# Patient Record
Sex: Female | Born: 1950 | Race: White | Hispanic: No | Marital: Married | Smoking: Current every day smoker
Health system: Southern US, Community
[De-identification: ages and names within clinical notes are randomized; demographics above are authoritative.]

## PROBLEM LIST (undated history)

## (undated) DIAGNOSIS — E079 Disorder of thyroid, unspecified: Secondary | ICD-10-CM

## (undated) DIAGNOSIS — E785 Hyperlipidemia, unspecified: Secondary | ICD-10-CM

## (undated) DIAGNOSIS — J449 Chronic obstructive pulmonary disease, unspecified: Secondary | ICD-10-CM

## (undated) DIAGNOSIS — J302 Other seasonal allergic rhinitis: Secondary | ICD-10-CM

## (undated) DIAGNOSIS — K5790 Diverticulosis of intestine, part unspecified, without perforation or abscess without bleeding: Secondary | ICD-10-CM

## (undated) DIAGNOSIS — H269 Unspecified cataract: Secondary | ICD-10-CM

## (undated) DIAGNOSIS — K2970 Gastritis, unspecified, without bleeding: Secondary | ICD-10-CM

## (undated) DIAGNOSIS — I10 Essential (primary) hypertension: Secondary | ICD-10-CM

## (undated) DIAGNOSIS — K219 Gastro-esophageal reflux disease without esophagitis: Secondary | ICD-10-CM

## (undated) DIAGNOSIS — R197 Diarrhea, unspecified: Secondary | ICD-10-CM

## (undated) DIAGNOSIS — F329 Major depressive disorder, single episode, unspecified: Secondary | ICD-10-CM

## (undated) DIAGNOSIS — F32A Depression, unspecified: Secondary | ICD-10-CM

## (undated) DIAGNOSIS — M1990 Unspecified osteoarthritis, unspecified site: Secondary | ICD-10-CM

## (undated) DIAGNOSIS — F419 Anxiety disorder, unspecified: Secondary | ICD-10-CM

## (undated) HISTORY — DX: Disorder of thyroid, unspecified: E07.9

## (undated) HISTORY — DX: Diarrhea, unspecified: R19.7

## (undated) HISTORY — DX: Gastro-esophageal reflux disease without esophagitis: K21.9

## (undated) HISTORY — DX: Depression, unspecified: F32.A

## (undated) HISTORY — DX: Other seasonal allergic rhinitis: J30.2

## (undated) HISTORY — PX: TONSILLECTOMY: SUR1361

## (undated) HISTORY — DX: Unspecified osteoarthritis, unspecified site: M19.90

## (undated) HISTORY — DX: Anxiety disorder, unspecified: F41.9

## (undated) HISTORY — DX: Hyperlipidemia, unspecified: E78.5

## (undated) HISTORY — PX: CARPAL TUNNEL RELEASE: SHX101

## (undated) HISTORY — DX: Major depressive disorder, single episode, unspecified: F32.9

## (undated) HISTORY — DX: Unspecified cataract: H26.9

## (undated) HISTORY — DX: Essential (primary) hypertension: I10

## (undated) HISTORY — DX: Gastritis, unspecified, without bleeding: K29.70

## (undated) HISTORY — DX: Chronic obstructive pulmonary disease, unspecified: J44.9

---

## 1981-04-11 HISTORY — PX: TUBAL LIGATION: SHX77

## 1997-12-08 ENCOUNTER — Inpatient Hospital Stay (HOSPITAL_COMMUNITY): Admission: RE | Admit: 1997-12-08 | Discharge: 1997-12-13 | Payer: Self-pay | Admitting: *Deleted

## 1997-12-16 ENCOUNTER — Encounter (HOSPITAL_COMMUNITY): Admission: RE | Admit: 1997-12-16 | Discharge: 1998-03-16 | Payer: Self-pay

## 1999-02-11 ENCOUNTER — Encounter: Payer: Self-pay | Admitting: Obstetrics and Gynecology

## 1999-02-11 ENCOUNTER — Encounter: Admission: RE | Admit: 1999-02-11 | Discharge: 1999-02-11 | Payer: Self-pay | Admitting: Obstetrics and Gynecology

## 2000-01-14 ENCOUNTER — Other Ambulatory Visit: Admission: RE | Admit: 2000-01-14 | Discharge: 2000-01-14 | Payer: Self-pay | Admitting: Obstetrics and Gynecology

## 2000-02-22 ENCOUNTER — Encounter: Payer: Self-pay | Admitting: Obstetrics and Gynecology

## 2000-02-22 ENCOUNTER — Encounter: Admission: RE | Admit: 2000-02-22 | Discharge: 2000-02-22 | Payer: Self-pay | Admitting: Obstetrics and Gynecology

## 2000-07-05 ENCOUNTER — Encounter: Payer: Self-pay | Admitting: Gastroenterology

## 2000-07-05 ENCOUNTER — Encounter: Admission: RE | Admit: 2000-07-05 | Discharge: 2000-07-05 | Payer: Self-pay | Admitting: Gastroenterology

## 2000-09-06 ENCOUNTER — Ambulatory Visit (HOSPITAL_COMMUNITY): Admission: RE | Admit: 2000-09-06 | Discharge: 2000-09-06 | Payer: Self-pay | Admitting: Gastroenterology

## 2001-01-17 ENCOUNTER — Other Ambulatory Visit: Admission: RE | Admit: 2001-01-17 | Discharge: 2001-01-17 | Payer: Self-pay | Admitting: Obstetrics and Gynecology

## 2001-02-22 ENCOUNTER — Encounter: Admission: RE | Admit: 2001-02-22 | Discharge: 2001-02-22 | Payer: Self-pay | Admitting: Obstetrics and Gynecology

## 2001-02-22 ENCOUNTER — Encounter: Payer: Self-pay | Admitting: Obstetrics and Gynecology

## 2002-01-21 ENCOUNTER — Other Ambulatory Visit: Admission: RE | Admit: 2002-01-21 | Discharge: 2002-01-21 | Payer: Self-pay | Admitting: Obstetrics and Gynecology

## 2002-03-06 ENCOUNTER — Encounter: Payer: Self-pay | Admitting: Obstetrics and Gynecology

## 2002-03-06 ENCOUNTER — Encounter: Admission: RE | Admit: 2002-03-06 | Discharge: 2002-03-06 | Payer: Self-pay | Admitting: Obstetrics and Gynecology

## 2003-02-19 ENCOUNTER — Other Ambulatory Visit: Admission: RE | Admit: 2003-02-19 | Discharge: 2003-02-19 | Payer: Self-pay | Admitting: Obstetrics and Gynecology

## 2003-03-11 ENCOUNTER — Encounter: Admission: RE | Admit: 2003-03-11 | Discharge: 2003-03-11 | Payer: Self-pay | Admitting: Obstetrics and Gynecology

## 2003-10-21 ENCOUNTER — Encounter: Admission: RE | Admit: 2003-10-21 | Discharge: 2003-10-21 | Payer: Self-pay | Admitting: Obstetrics and Gynecology

## 2004-02-25 ENCOUNTER — Other Ambulatory Visit: Admission: RE | Admit: 2004-02-25 | Discharge: 2004-02-25 | Payer: Self-pay | Admitting: Obstetrics and Gynecology

## 2004-03-15 ENCOUNTER — Encounter: Admission: RE | Admit: 2004-03-15 | Discharge: 2004-03-15 | Payer: Self-pay | Admitting: Obstetrics and Gynecology

## 2005-03-16 ENCOUNTER — Encounter: Admission: RE | Admit: 2005-03-16 | Discharge: 2005-03-16 | Payer: Self-pay | Admitting: Obstetrics and Gynecology

## 2005-05-24 ENCOUNTER — Other Ambulatory Visit: Admission: RE | Admit: 2005-05-24 | Discharge: 2005-05-24 | Payer: Self-pay | Admitting: Obstetrics and Gynecology

## 2005-06-09 ENCOUNTER — Ambulatory Visit: Payer: Self-pay | Admitting: Internal Medicine

## 2006-05-01 ENCOUNTER — Encounter: Admission: RE | Admit: 2006-05-01 | Discharge: 2006-05-01 | Payer: Self-pay | Admitting: Obstetrics and Gynecology

## 2006-05-30 ENCOUNTER — Ambulatory Visit: Payer: Self-pay | Admitting: Internal Medicine

## 2006-06-05 ENCOUNTER — Encounter (INDEPENDENT_AMBULATORY_CARE_PROVIDER_SITE_OTHER): Payer: Self-pay | Admitting: Specialist

## 2006-06-05 ENCOUNTER — Ambulatory Visit (HOSPITAL_COMMUNITY): Admission: RE | Admit: 2006-06-05 | Discharge: 2006-06-05 | Payer: Self-pay | Admitting: Internal Medicine

## 2006-06-09 ENCOUNTER — Ambulatory Visit: Payer: Self-pay | Admitting: Internal Medicine

## 2007-05-08 ENCOUNTER — Encounter: Admission: RE | Admit: 2007-05-08 | Discharge: 2007-05-08 | Payer: Self-pay | Admitting: Obstetrics and Gynecology

## 2008-05-20 ENCOUNTER — Encounter: Admission: RE | Admit: 2008-05-20 | Discharge: 2008-05-20 | Payer: Self-pay | Admitting: Obstetrics and Gynecology

## 2008-09-11 ENCOUNTER — Observation Stay (HOSPITAL_COMMUNITY): Admission: EM | Admit: 2008-09-11 | Discharge: 2008-09-12 | Payer: Self-pay | Admitting: Emergency Medicine

## 2009-01-23 ENCOUNTER — Encounter: Admission: RE | Admit: 2009-01-23 | Discharge: 2009-01-23 | Payer: Self-pay | Admitting: Family Medicine

## 2009-06-12 ENCOUNTER — Encounter: Admission: RE | Admit: 2009-06-12 | Discharge: 2009-06-12 | Payer: Self-pay | Admitting: Obstetrics and Gynecology

## 2009-08-05 ENCOUNTER — Ambulatory Visit (HOSPITAL_BASED_OUTPATIENT_CLINIC_OR_DEPARTMENT_OTHER): Admission: RE | Admit: 2009-08-05 | Discharge: 2009-08-05 | Payer: Self-pay | Admitting: Orthopedic Surgery

## 2010-04-07 ENCOUNTER — Ambulatory Visit
Admission: RE | Admit: 2010-04-07 | Discharge: 2010-04-07 | Payer: Self-pay | Source: Home / Self Care | Attending: Orthopedic Surgery | Admitting: Orthopedic Surgery

## 2010-05-02 ENCOUNTER — Encounter: Payer: Self-pay | Admitting: Obstetrics and Gynecology

## 2010-05-31 NOTE — Op Note (Signed)
  Breanna Wolfe, Breanna Wolfe               ACCOUNT NO.:  [id number]  MEDICAL RECORD NO.:  [id number]          PATIENT TYPE:  AMB  LOCATION:  DSC                          FACILITY:  [facility]  PHYSICIAN:  Cindee Salt, M.D.       DATE OF BIRTH:  Jan 15, 1951  DATE OF PROCEDURE: DATE OF DISCHARGE:                              OPERATIVE REPORT   PREOPERATIVE DIAGNOSIS:  Carpal tunnel syndrome, right hand.  POSTOPERATIVE DIAGNOSIS:  Carpal tunnel syndrome, right hand.  OPERATION:  Decompression, right median nerve.  SURGEON:  Cindee Salt, MD  ASSISTANT:  Carolyne Fiscal, RN  ANESTHESIA:  Forearm based IV regional with local infiltration.  DATE OF OPERATION:  April 07, 2010.  ANESTHESIOLOGISTJean Rosenthal.  HISTORY:  The patient is a 60 year old female with a history of carpal tunnel syndrome.  EMG nerve conduction is positive.  This has not responded to conservative treatment.  She has elected to undergo surgical decompression.  Pre, peri, and postoperative course had been discussed along with risks and complications.  She is aware that there is no guarantee with surgery, possibility of infection, recurrence injury to arteries, nerves, tendons, incomplete relief of symptoms, dystrophy.  In the preoperative area, the patient is seen.  The extremity was marked by both the patient and surgeon.  Antibiotic given.  PROCEDURE:  The patient was brought to the operating room where a forearm based IV regional anesthetic was carried out without difficulty. She was prepped using ChloraPrep, supine position, right arm free.  A 3- minute dry time was allowed.  Time-out was taken confirming the patient procedure.  A longitudinal incision was made in the palm, carried down through subcutaneous tissue.  Bleeders were electrocauterized.  Palmar fascia was split, superficial palmar arch was identified.  The flexor tendon to the ring little finger identified. The ulnar side of median nerve at carpal retinaculum was  incised with sharp dissection.  A right- angle and Sewell retractor were placed between skin and forearm fascia. The fascia was released for approximately a centimeter and half proximal to the wrist crease under direct vision.  Canal was explored.  Air compression to the nerve was apparent.  No further lesions were identified.  The wound was irrigated.  Skin closed with interrupted 5-0 Vicryl Rapide sutures.  A sterile compressive dressing and splint was applied after infiltration of the area of the incision with 0.25% Marcaine without epinephrine, 5 mL was used.  The patient tolerated the procedure well and was taken to the recovery room for observation in satisfactory condition.  She will be discharged home to return to the [facility] in 1 week on Vicodin.          ______________________________ Cindee Salt, M.D.     GK/MEDQ  D:  04/07/2010  T:  04/07/2010  Job:  [id number]  Electronically Signed by Cindee Salt M.D. on 05/31/2010 02:21:32 PM

## 2010-06-21 LAB — BASIC METABOLIC PANEL
BUN: 21 mg/dL (ref 6–23)
Chloride: 104 mEq/L (ref 96–112)
Creatinine, Ser: 0.84 mg/dL (ref 0.4–1.2)
GFR calc non Af Amer: >60 mL/min (ref >60)
Glucose, Bld: 101 mg/dL — ABNORMAL HIGH (ref 70–99)

## 2010-06-21 LAB — POCT HEMOGLOBIN-HEMACUE: Hemoglobin: 13.6 g/dL (ref 12.0–15.0)

## 2010-06-29 LAB — BASIC METABOLIC PANEL
BUN: 20 mg/dL (ref 6–23)
Chloride: 110 mEq/L (ref 96–112)
Creatinine, Ser: 0.8 mg/dL (ref 0.4–1.2)
Glucose, Bld: 120 mg/dL — ABNORMAL HIGH (ref 70–99)

## 2010-06-29 LAB — POCT HEMOGLOBIN-HEMACUE: Hemoglobin: 12.1 g/dL (ref 12.0–15.0)

## 2010-07-05 ENCOUNTER — Other Ambulatory Visit: Payer: Self-pay | Admitting: Obstetrics and Gynecology

## 2010-07-05 DIAGNOSIS — Z1231 Encounter for screening mammogram for malignant neoplasm of breast: Secondary | ICD-10-CM

## 2010-07-14 ENCOUNTER — Ambulatory Visit
Admission: RE | Admit: 2010-07-14 | Discharge: 2010-07-14 | Disposition: A | Discharge status: Home / Self Care | Payer: Medicare Other | Source: Ambulatory Visit | Attending: Obstetrics and Gynecology | Admitting: Obstetrics and Gynecology

## 2010-07-14 DIAGNOSIS — Z1231 Encounter for screening mammogram for malignant neoplasm of breast: Secondary | ICD-10-CM

## 2010-07-19 LAB — APTT: aPTT: 25 seconds (ref 24–37)

## 2010-07-19 LAB — POCT CARDIAC MARKERS
CKMB, poc: 1.6 ng/mL (ref 1.0–8.0)
Myoglobin, poc: 88.5 ng/mL (ref 12–200)
Troponin i, poc: <0.05 ng/mL (ref 0.00–0.09)

## 2010-07-19 LAB — CARDIAC PANEL(CRET KIN+CKTOT+MB+TROPI)
CK, MB: 2.3 ng/mL (ref 0.3–4.0)
CK, MB: 2.5 ng/mL (ref 0.3–4.0)
Relative Index: 1.8 (ref 0.0–2.5)
Total CK: 119 U/L (ref 7–177)
Total CK: 139 U/L (ref 7–177)
Total CK: 140 U/L (ref 7–177)
Troponin I: 0.01 ng/mL (ref 0.00–0.06)

## 2010-07-19 LAB — POCT I-STAT, CHEM 8
BUN: 21 mg/dL (ref 6–23)
Calcium, Ion: 1.09 mmol/L — ABNORMAL LOW (ref 1.12–1.32)
Chloride: 104 mEq/L (ref 96–112)
HCT: 42 % (ref 36.0–46.0)
Potassium: 4.3 mEq/L (ref 3.5–5.1)

## 2010-07-19 LAB — LIPID PANEL
Cholesterol: 315 mg/dL — ABNORMAL HIGH (ref 0–200)
LDL Cholesterol: 194 mg/dL — ABNORMAL HIGH (ref 0–99)
Total CHOL/HDL Ratio: 3.5 RATIO
Triglycerides: 157 mg/dL — ABNORMAL HIGH (ref <150)

## 2010-07-19 LAB — PROTIME-INR: INR: 0.9 (ref 0.00–1.49)

## 2010-08-24 NOTE — H&P (Signed)
NAMEGLENDENE, Breanna Wolfe               ACCOUNT NO.:  [id number]   MEDICAL RECORD NO.:  [id number]          PATIENT TYPE:  OBV   LOCATION:  3732                         FACILITY:  [facility]   PHYSICIAN:  Lonia Blood, M.D.       DATE OF BIRTH:  [birth year]   DATE OF ADMISSION:  09/11/2008  DATE OF DISCHARGE:                              HISTORY & PHYSICAL   PRIMARY CARE PHYSICIAN:  Emeterio Reeve, MD   CHIEF COMPLAINT:  Chest pain.   HISTORY OF PRESENT ILLNESS:  Breanna Wolfe is a 60 year old woman with  history of tobacco abuse, woke up at 5:30 a.m. with some severe  substernal chest pain.  This was followed by some nausea, weakness, and  radiation to the jaw.  At the time of the event, the blood pressure was  extremely elevated.  The patient took an aspirin, called 911, and came  to the emergency room.  She does not have any symptoms currently.  She  does not have any personal cardiac history or strong family history of  coronary artery disease.   PAST MEDICAL HISTORY:  1. Hypothyroidism.  2. Gastroesophageal reflux disease.  3. Depression.  4. Anxiety.  5. Irritable bowel syndrome.  6. Hypertension.  7. Osteoarthritis.   PAST SURGICAL HISTORY:  Tonsillectomy and bilateral tubal ligation.   SOCIAL HISTORY:  The patient quit smoking 6 weeks ago.  She is retired.  Does not drink alcohol.  Does not use any illicit drugs.   FAMILY HISTORY:  The patient's mother is alive and has hypothyroidism  and hypertension as well as carotid endarterectomy.  The patient's  father passed away of unknown causes.  The patient has 3 sisters and 1  brother, they do not have coronary artery disease.   HOME MEDICATIONS:  1. Atenolol 50 mg twice a day.  2. Levothyroxine 150 mcg daily.  3. Mobic 15 mg daily as needed.  4. Omeprazole 40 mg daily.  5. Wellbutrin 150 mg twice a day.  6. Zoloft 100 mg daily.   REVIEW OF SYSTEMS:  As per HPI.  Also positive for some joint achiness  and pain.  All other  systems reviewed are negative.   PHYSICAL EXAMINATION:  VITAL SIGNS:  Upon admission, temperature is  97.6, heart rate 71, respiratory rate 20, blood pressure 135/89,  saturation 96% on room air.  GENERAL:  The patient is alert, oriented, in no acute distress.  HEENT:  Head normocephalic, atraumatic.  Eyes pupils equal, round,  reactive to light and accommodation.  Extraocular movements intact.  Throat clear.  NECK:  Supple.  No JVD.  CHEST:  Clear to auscultation without wheezes, rhonchi, or crackles.  HEART:  Regular rate and rhythm without murmurs, rubs, or gallops.  ABDOMEN:  Soft, nontender, nondistended.  Bowels sounds are present.  SKIN:  Warm and dry without any suspicious-looking rashes.  NEUROLOGICAL:  Cranial nerves II-XII intact.  Strength 5/5 in all 4  extremities.  Sensation intact.   LABORATORY VALUES:  On admission, myoglobin 18.5, platelet count 122.  Sodium 139, potassium 4.3, chloride 104, bicarbonate 25, BUN 21,  creatinine 1.  EKG shows normal sinus rhythm without any significant ST-  T changes.   ASSESSMENT AND PLAN:  1. Chest pain.  The patient's story is compatible with typical angina      due to location, short duration, and no clear explanation to      suggest other pathology.  She has a history of tobacco abuse, and      based on her age, she is an intermediate risk for significant CAD,      according to [facility] clinical criteria.  Our plan is to place the      patient on observation, rule her out for myocardial infarction by      cycling cardiac enzymes, and if she does well overnight, arrange      for an outpatient stress testing. We will send also a fasting lipid      panel.  2. Gastroesophageal reflux disease.  Continue proton pump inhibitor      without changes and assess compliance  3. Depression and anxiety.  Continue Wellbutrin and Zoloft without      changes.  4. Hypothyroidism - we will check a TSH and continue levothyroxin.      Lonia Blood,  M.D.  Electronically Signed     SL/MEDQ  D:  09/11/2008  T:  09/12/2008  Job:  [id number]   cc:   Emeterio Reeve, MD

## 2010-08-27 NOTE — Assessment & Plan Note (Signed)
[facility]                         GASTROENTEROLOGY OFFICE NOTE   NAME:Dirks, YOSHIKO KELEHER                      MRN:          [id number]  DATE:05/30/2006                            DOB:          1950-10-01    Ms. Piazza is a very nice 60 year old white female who is here today  with the same complaints as a year ago of gastroesophageal reflux and  left lower quadrant abdominal pain.  We saw her on June 09, 2005 because  of gastroesophageal reflux and abdominal pain, and it was my impression  that she had gastroesophageal reflux aggravated by smoking and stress.  She already had a previous colonoscopy in 2002, but because of family  history of rectal cancer in her mother, she is due for another  colonoscopy.  She has persistent left lower quadrant anterior abdominal  pain, which is worse when she is under stress.  There was no definite  diverticulosis on colonoscopy 6 years ago.  She denies any rectal  bleeding.  She continues to smoke and drinks excessive amount of  caffeine through the day.   MEDICATIONS:  1. Nexium 40 mg p.o. daily.  2. Atenolol 50 mg p.o. daily.  3. Zoloft 100 mg p.o. daily.  4. Synthroid 150 mcg p.o. daily.  5. Mobic has been on hold.   PHYSICAL EXAMINATION:  Blood pressure 130/72.  Pulse 68.  Weight 148  pounds.  She was alert, oriented, and in no distress.  LUNGS:  Clear auscultation.  COR:  Normal S1 and normal S2.  There was minimal discomfort in the subxyphoid area.  No discomfort at  the sternal area.  Abdomen was otherwise soft, but there was definite  tenderness throughout the sigmoid colon and left middle quadrant of the  abdomen.  No fullness or palpable mass.  EXTREMITIES:  No edema.   IMPRESSION:  62. A 60 year old white female with gastroesophageal reflux aggravated      by stress and smoking.  2. Suspect irritable bowel syndrome with predominant left lower      quadrant abdominal pain.  Rule out  diverticulosis.   PLAN:  1. As per patient's request, we will switch from Nexium to Protonix 40      mg p.o. b.i.d. x1 week, then daily.  2. Schedule upper endoscopy.  3. Levsin sublingually 0.125 mg b.i.d.  4. Colonoscopy scheduled.  5. Quit smoking.     Hedwig Morton. Juanda Chance, MD  Electronically Signed    DMB/MedQ  DD: 05/30/2006  DT: 05/30/2006  Job #: [id number]   cc:   Oley Balm. Georgina Pillion, M.D.

## 2010-08-27 NOTE — Discharge Summary (Signed)
NAMEMAIGEN, MOZINGO               ACCOUNT NO.:  [id number]   MEDICAL RECORD NO.:  [id number]          PATIENT TYPE:  OBV   LOCATION:  3732                         FACILITY:  [facility]   PHYSICIAN:  Lonia Blood, M.D.       DATE OF BIRTH:  1950/12/03   DATE OF ADMISSION:  09/11/2008  DATE OF DISCHARGE:  09/12/2008                               DISCHARGE SUMMARY   PRIMARY CARE PHYSICIAN:  Emeterio Reeve, MD   DISCHARGE DIAGNOSES:  1. Chest pain - ruled out for myocardial infarction.  2. Hypothyroidism - with persistent elevated TSH.  3. Gastroesophageal reflux disease.  4. Depression.  5. History of tobacco abuse, recently quit smoking.  6. Hypertension.  7. Hyperlipidemia.   DISCHARGE MEDICATIONS:  1. Atenolol 50 mg twice a day.  2. Levothyroxine 175 mcg daily.  3. Omeprazole 40 mg daily.  4. Zoloft 100 mg daily.  5. Aspirin 81 mg daily.  6. Ranitidine 75 mg by mouth as needed for breakthrough acid reflux      symptoms.  7. Zocor 40 mg daily.   CONDITION ON DISCHARGE:  Mrs. Calais discharged in good condition.  She  is chest painfree and she will follow up with Dr. Eldridge Dace from [facility] as well as with her primary care physician.   PROCEDURES THIS ADMISSION:  No procedures done.   CONSULTATIONS THIS ADMISSION:  No consultations obtained.   HISTORY AND PHYSICAL:  Refer to dictated H and P done by Dr. Lavera Guise on  September 11, 2008.   HOSPITAL COURSE:  Chest pain.  Mrs. Bialas was admitted with a history  of chest discomfort from the emergency room.  She did not have any EKG  changes, and her troponins remained negative.  While her symptoms could  have been due to coronary artery disease, there is a good likelihood  that her symptoms were actually due to severe gastroesophageal reflux  disease.  Ms. Warburton has intermediate risk for coronary artery disease  based on [facility] clinical criteria and she will follow up with Dr. Eldridge Dace  for consultation and consideration of a  stress test.  The patient was  encouraged to continue with her smoking cessation. She was started on  aspirin and the other risk factors were addressed as well.  We performed  a fasting lipid panel and the patient's total cholesterol was 315 and  LDL was  194.  She was initiated on a statin and she will follow up with her  primary care physician.  Also Ms. Duval's TSH was found to be elevated  at 16.7.  Her Synthroid was increased and she will follow up with her  primary care physician for recheck of TSH in 1 month.      Lonia Blood, M.D.  Electronically Signed     SL/MEDQ  D:  09/15/2008  T:  09/16/2008  Job:  [id number]   cc:   Emeterio Reeve, MD  Corky Crafts, MD

## 2010-08-27 NOTE — Procedures (Signed)
[facility]  Patient:    Breanna Wolfe, Breanna Wolfe                      MRN: [id number] Proc. Date: 09/06/00 Adm. Date:  [id number] Attending:  Orland Mustard CC:         Oley Balm. Georgina Pillion, M.D.                           Procedure Report  PROCEDURE:  Colonoscopy.  ENDOSCOPIST:  Llana Aliment. Edwards, M.D.  MEDICATION:  Fentanyl 75 mcg and Versed 9 mg IV.  INDICATION:  Left lower quadrant pain, treated empirically for diverticulitis, CT scan showing mild wall thickening in the sigmoid colon with occasional tics, possible diverticulitis versus other lesion such as tumor.  This procedure is done to evaluate this.  SCOPE:  Pediatric video colonoscope.  DESCRIPTION OF PROCEDURE:  Procedure had been explained to patient and consent obtained.  With the patient in the left lateral decubitus position, the pediatric video colonoscope was inserted and advanced under direct visualization.  Prep was excellent and easily advanced to the cecum.  Right lower quadrant was transilluminated and ileocecal valve seen.  Scope withdrawn; the cecum, ascending colon, hepatic flexure, transverse colon, splenic flexure, descending and sigmoid colon were seen well.  Multiple passes were made through the sigmoid colon.  There were only a few scattered diverticula.  No polyps, tumors or other lesions were seen.  Scope was withdrawn into the rectum and the rectum was free of polyps.  There were internal hemorrhoids in the anal canal.  The scope was withdrawn.  Patient tolerated procedure well.  ASSESSMENT:  Abnormal CT with no evidence of diverticular disease of any significance, no tumor, etc.  At this point, the patient does have occasional scattered diverticula and could have had mild diverticulitis but currently has nothing to suggest that.  PLAN:  We will treat as if this had been diverticulitis with chronic Metamucil or Citrucel.  We will see back in the office in six months or  sooner as needed.DD:  09/06/00 TD:  09/06/00 Job: [id number]

## 2010-10-06 ENCOUNTER — Telehealth: Payer: Self-pay | Admitting: Internal Medicine

## 2010-10-07 NOTE — Telephone Encounter (Signed)
Pt states that she is having problems with constipation. She has noticed a change in her bowel habits and would like to be seen. Appt made for pt to see Dr. Juanda Chance 10/21/10@2pm . Pt aware of appt date and time.

## 2010-10-20 ENCOUNTER — Encounter: Payer: Self-pay | Admitting: Internal Medicine

## 2010-10-21 ENCOUNTER — Encounter: Payer: Self-pay | Admitting: Internal Medicine

## 2010-10-21 ENCOUNTER — Ambulatory Visit (INDEPENDENT_AMBULATORY_CARE_PROVIDER_SITE_OTHER): Payer: Medicare Other | Admitting: Internal Medicine

## 2010-10-21 VITALS — BP 128/62 | HR 74 | Ht 63.0 in | Wt 162.0 lb

## 2010-10-21 DIAGNOSIS — K219 Gastro-esophageal reflux disease without esophagitis: Secondary | ICD-10-CM

## 2010-10-21 DIAGNOSIS — K589 Irritable bowel syndrome without diarrhea: Secondary | ICD-10-CM

## 2010-10-21 MED ORDER — HYDROCORTISONE ACE-PRAMOXINE 2.5-1 % RE CREA: TOPICAL_CREAM | Freq: Two times a day (BID) | RECTAL | Status: AC

## 2010-10-21 MED ORDER — DICYCLOMINE HCL 10 MG PO CAPS: 10.0000 mg | ORAL_CAPSULE | ORAL | Status: DC

## 2010-10-21 NOTE — Patient Instructions (Addendum)
We have sent the following medications to your pharmacy for you to pick up at your convenience: Bentyl 10 mg. Take 1 tablet by mouth every morning. Analpram 2.5% cream. Apply twice daily to rectum You will be due for a recall colonoscopy in February 2013.Dr Mila Palmer

## 2010-10-21 NOTE — Progress Notes (Signed)
Breanna Wolfe 10/07/1950 MRN [id number]    History of Present Illness:  This is a 60 year old white female with irritable bowel syndrome and periodic urgent diarrhea. She also has gastroesophageal reflux which is controlled with Prilosec 20 mg a day. She has been under a lot of stress. She takes care of her grandchildren 2 days a week. Diarrhea causes her rectum to be inflamed and irritated for several days. She denies any rectal bleeding. Her mother was diagnosed with rectal cancer. Patient's last colonoscopy in 2008 was normal. An upper endoscopy with biopsies in February 2008 showed gastritis related to anti-inflammatory agents. She stopped smoking 2 years ago. She still drinks several caffeinated beverages per day.    Past Medical History  Diagnosis Date  . GERD (gastroesophageal reflux disease)   . Hypertension   . DJD (degenerative joint disease)   . Anxiety   . Depression   . Gastritis   . Hyperlipemia   . Constipation   . Diarrhea    Past Surgical History  Procedure Date  . Tubal ligation 1983  . Tonsillectomy   . Carpal tunnel release     Bilateral     reports that she has quit smoking. She has never used smokeless tobacco. She reports that she drinks alcohol. She reports that she does not use illicit drugs. family history includes Breast cancer in her maternal aunt and Rectal cancer in her mother.  There is no history of Colon cancer. Allergies  Allergen Reactions  . Iohexol   . Sulfa Drugs Cross Reactors         Review of Systems: Denies any upper GI symptoms. Positive for crampy abdominal pain urgent diarrhea  The remainder of the 10  point ROS is negative except as outlined in H&P   Physical Exam: General appearance  Well developed, in no distress. Eyes- non icteric. HEENT nontraumatic, normocephalic. Mouth no lesions, tongue papillated, no cheilosis. Neck supple without adenopathy, thyroid not enlarged, no carotid bruits, no JVD. Lungs Clear to  auscultation bilaterally. Cor normal S1 normal S2, regular rhythm , no murmur,  quiet precordium. Abdomen soft with mild tenderness in left lower quadrant. No palpable mass. Upper abdomen unremarkable. Rectal: Small external hemorrhoids. Normal rectal sphincter tone. Soft Hemoccult-negative stool. Extremities no pedal edema. Skin no lesions. Neurological alert and oriented x 3. Psychological normal mood and affect.  Assessment and Plan:  Problem #1 irritable bowel syndrome. She is up-to-date on her colonoscopy. There is no occult GI blood loss. Symptoms are stress related. We will start her on Bentyl 10 mg every morning. She is to continue probiotics and high fiber diet. A recall colonoscopy will be due in February 2013.  Problem #2 rectal irritation. We will start Analpram cream 2.5% when necessary.  Problem #3 gastroesophageal reflux controlled with omeprazole 20 mg daily.      10/21/2010 Lina Sar

## 2011-04-12 HISTORY — PX: CARPAL TUNNEL RELEASE: SHX101

## 2011-05-25 ENCOUNTER — Ambulatory Visit (INDEPENDENT_AMBULATORY_CARE_PROVIDER_SITE_OTHER): Payer: Medicare Other | Admitting: Internal Medicine

## 2011-05-25 ENCOUNTER — Telehealth: Payer: Self-pay | Admitting: Internal Medicine

## 2011-05-25 DIAGNOSIS — R0602 Shortness of breath: Secondary | ICD-10-CM

## 2011-05-25 LAB — PULMONARY FUNCTION TEST

## 2011-05-25 NOTE — Telephone Encounter (Signed)
I spoke with pt and advised her she wanted to know when she will find out her pft results. I advised her that she will have to call her doctor's office that ordered this for those results. Nothing further was needed

## 2011-05-25 NOTE — Progress Notes (Signed)
PFT done today. 

## 2011-06-06 ENCOUNTER — Encounter: Payer: Self-pay | Admitting: Internal Medicine

## 2011-07-01 ENCOUNTER — Ambulatory Visit (AMBULATORY_SURGERY_CENTER): Payer: Medicare Other | Admitting: *Deleted

## 2011-07-01 VITALS — Ht 66.0 in | Wt 162.7 lb

## 2011-07-01 DIAGNOSIS — Z1211 Encounter for screening for malignant neoplasm of colon: Secondary | ICD-10-CM

## 2011-07-01 MED ORDER — PEG-KCL-NACL-NASULF-NA ASC-C 100 G PO SOLR: ORAL | Status: DC

## 2011-07-15 ENCOUNTER — Encounter: Payer: Medicare Other | Admitting: Internal Medicine

## 2012-01-31 ENCOUNTER — Encounter: Payer: Self-pay | Admitting: Internal Medicine

## 2012-06-12 ENCOUNTER — Other Ambulatory Visit: Payer: Self-pay

## 2012-06-12 ENCOUNTER — Other Ambulatory Visit: Payer: Self-pay | Admitting: Obstetrics and Gynecology

## 2012-06-12 DIAGNOSIS — M8580 Other specified disorders of bone density and structure, unspecified site: Secondary | ICD-10-CM

## 2012-06-12 DIAGNOSIS — Z1231 Encounter for screening mammogram for malignant neoplasm of breast: Secondary | ICD-10-CM

## 2012-07-19 ENCOUNTER — Ambulatory Visit
Admission: RE | Admit: 2012-07-19 | Discharge: 2012-07-19 | Disposition: A | Discharge status: Home / Self Care | Payer: Medicare PPO | Source: Ambulatory Visit | Attending: Obstetrics and Gynecology | Admitting: Obstetrics and Gynecology

## 2012-07-19 ENCOUNTER — Ambulatory Visit
Admission: RE | Admit: 2012-07-19 | Discharge: 2012-07-19 | Disposition: A | Discharge status: Home / Self Care | Payer: Medicare PPO | Source: Ambulatory Visit

## 2012-07-19 DIAGNOSIS — Z1231 Encounter for screening mammogram for malignant neoplasm of breast: Secondary | ICD-10-CM

## 2012-07-19 DIAGNOSIS — M8580 Other specified disorders of bone density and structure, unspecified site: Secondary | ICD-10-CM

## 2012-11-19 ENCOUNTER — Ambulatory Visit
Admission: RE | Admit: 2012-11-19 | Discharge: 2012-11-19 | Disposition: A | Discharge status: Home / Self Care | Payer: Medicare PPO | Source: Ambulatory Visit | Attending: Family Medicine | Admitting: Family Medicine

## 2012-11-19 ENCOUNTER — Other Ambulatory Visit: Payer: Self-pay | Admitting: Family Medicine

## 2012-11-19 DIAGNOSIS — M542 Cervicalgia: Secondary | ICD-10-CM

## 2012-11-19 DIAGNOSIS — M25562 Pain in left knee: Secondary | ICD-10-CM

## 2012-11-19 DIAGNOSIS — M25561 Pain in right knee: Secondary | ICD-10-CM

## 2013-06-03 ENCOUNTER — Encounter: Payer: Self-pay | Admitting: Internal Medicine

## 2013-07-17 ENCOUNTER — Ambulatory Visit (AMBULATORY_SURGERY_CENTER): Payer: Self-pay

## 2013-07-17 VITALS — Ht 66.0 in | Wt 150.0 lb

## 2013-07-17 DIAGNOSIS — Z800 Family history of malignant neoplasm of digestive organs: Secondary | ICD-10-CM

## 2013-07-17 MED ORDER — MOVIPREP 100 G PO SOLR: 1.0000 | Freq: Once | ORAL | Status: DC

## 2013-07-31 ENCOUNTER — Ambulatory Visit (AMBULATORY_SURGERY_CENTER): Payer: Medicare PPO | Admitting: Internal Medicine

## 2013-07-31 ENCOUNTER — Encounter: Payer: Self-pay | Admitting: Internal Medicine

## 2013-07-31 VITALS — BP 151/82 | HR 69 | Temp 98.7°F | Resp 39 | Ht 66.0 in | Wt 150.0 lb

## 2013-07-31 DIAGNOSIS — D126 Benign neoplasm of colon, unspecified: Secondary | ICD-10-CM

## 2013-07-31 DIAGNOSIS — Z1211 Encounter for screening for malignant neoplasm of colon: Secondary | ICD-10-CM

## 2013-07-31 DIAGNOSIS — Z800 Family history of malignant neoplasm of digestive organs: Secondary | ICD-10-CM

## 2013-07-31 MED ORDER — SODIUM CHLORIDE 0.9 % IV SOLN: 500.0000 mL | INTRAVENOUS | Status: DC

## 2013-07-31 NOTE — Patient Instructions (Signed)
No Aspirin or anti inflammatory products for 2 weeks   YOU HAD AN ENDOSCOPIC PROCEDURE TODAY AT [facility]: Refer to the procedure report that was given to you for any specific questions about what was found during the examination.  If the procedure report does not answer your questions, please call your gastroenterologist to clarify.  If you requested that your care partner not be given the details of your procedure findings, then the procedure report has been included in a sealed envelope for you to review at your convenience later.  YOU SHOULD EXPECT: Some feelings of bloating in the abdomen. Passage of more gas than usual.  Walking can help get rid of the air that was put into your GI tract during the procedure and reduce the bloating. If you had a lower endoscopy (such as a colonoscopy or flexible sigmoidoscopy) you may notice spotting of blood in your stool or on the toilet paper. If you underwent a bowel prep for your procedure, then you may not have a normal bowel movement for a few days.  DIET: Your first meal following the procedure should be a light meal and then it is ok to progress to your normal diet.  A half-sandwich or bowl of soup is an example of a good first meal.  Heavy or fried foods are harder to digest and may make you feel nauseous or bloated.  Likewise meals heavy in dairy and vegetables can cause extra gas to form and this can also increase the bloating.  Drink plenty of fluids but you should avoid alcoholic beverages for 24 hours.  ACTIVITY: Your care partner should take you home directly after the procedure.  You should plan to take it easy, moving slowly for the rest of the day.  You can resume normal activity the day after the procedure however you should NOT DRIVE or use heavy machinery for 24 hours (because of the sedation medicines used during the test).    SYMPTOMS TO REPORT IMMEDIATELY: A gastroenterologist can be reached at any hour.  During  normal business hours, 8:30 AM to 5:00 PM Monday through Friday, call [phone].  After hours and on weekends, please call the GI answering service at [phone] who will take a message and have the physician on call contact you.   Following lower endoscopy (colonoscopy or flexible sigmoidoscopy):  Excessive amounts of blood in the stool  Significant tenderness or worsening of abdominal pains  Swelling of the abdomen that is new, acute  Fever of 100F or higher    FOLLOW UP: If any biopsies were taken you will be contacted by phone or by letter within the next 1-3 weeks.  Call your gastroenterologist if you have not heard about the biopsies in 3 weeks.  Our staff will call the home number listed on your records the next business day following your procedure to check on you and address any questions or concerns that you may have at that time regarding the information given to you following your procedure. This is a courtesy call and so if there is no answer at the home number and we have not heard from you through the emergency physician on call, we will assume that you have returned to your regular daily activities without incident.  SIGNATURES/CONFIDENTIALITY: You and/or your care partner have signed paperwork which will be entered into your electronic medical record.  These signatures attest to the fact that that the information above on your After Visit Summary  has been reviewed and is understood.  Full responsibility of the confidentiality of this discharge information lies with you and/or your care-partner.   Information on polyps ,diverticulosis ,and high fiber diet given to you today

## 2013-07-31 NOTE — Progress Notes (Signed)
Called to room to assist during endoscopic procedure.  Patient ID and intended procedure confirmed with present staff. Received instructions for my participation in the procedure from the performing physician.  

## 2013-07-31 NOTE — Op Note (Signed)
[place]  [address]. [place], [id number]   COLONOSCOPY PROCEDURE REPORT  PATIENT: Breanna Wolfe, Breanna Wolfe  MR#: [id number] BIRTHDATE: 24-Jun-1950 , 31  yrs. old GENDER: Female ENDOSCOPIST: Lafayette Dragon, MD REFERRED VH:QIONGE Stephanie Acre, M.D. PROCEDURE DATE:  07/31/2013 PROCEDURE:   Colonoscopy with snare polypectomy First Screening Colonoscopy - Avg.  risk and is 50 yrs.  old or older - No.  Prior Negative Screening - Now for repeat screening. Above average risk  History of Adenoma - Now for follow-up colonoscopy & has been > or = to 3 yrs.  N/A  Polyps Removed Today? Yes. ASA CLASS:   Class II INDICATIONS:Patient's immediate family history of colon cancer and mother with rectal cancer.  Last colonoscopy in February 2008 was normal. MEDICATIONS: MAC sedation, administered by CRNA and Propofol (Diprivan) 250 mg IV  DESCRIPTION OF PROCEDURE:   After the risks benefits and alternatives of the procedure were thoroughly explained, informed consent was obtained.  A digital rectal exam revealed no abnormalities of the rectum.   The LB [id number]  endoscope was introduced through the anus and advanced to the cecum, which was identified by both the appendix and ileocecal valve. No adverse events experienced.   The quality of the prep was excellent, using MoviPrep  The instrument was then slowly withdrawn as the colon was fully examined.      COLON FINDINGS: A sessile polyp measuring 12 mm in size was found at the cecum.  A polypectomy was performed using snare cautery.  The resection was complete and the polyp tissue was completely retrieved.   Mild diverticulosis was noted in the sigmoid colon. Retroflexed views revealed no abnormalities. The time to cecum=4 minutes 09 seconds.  Withdrawal time=8 minutes 41 seconds.  The scope was withdrawn and the procedure completed. COMPLICATIONS: There were no complications.  ENDOSCOPIC IMPRESSION: 1.   Sessile polyp  measuring 12 mm in size was found at the cecum; polypectomy was performed using snare cautery 2.   Mild diverticulosis was noted in the sigmoid colon  RECOMMENDATIONS: 1.  Await pathology results 2.  no aspirin or Advil for 2 weeks Recall colonoscopy pending path report High-fiber diet   eSigned:  Lafayette Dragon, MD 07/31/2013 12:09 PM   cc:   PATIENT NAME:  Breanna Wolfe, Breanna Wolfe MR#: [id number]

## 2013-08-01 ENCOUNTER — Telehealth: Payer: Self-pay | Admitting: *Deleted

## 2013-08-01 NOTE — Telephone Encounter (Signed)
  Follow up Call-  Call back number 07/31/2013  Post procedure Call Back phone  # [phone]  Permission to leave phone message Yes     Patient questions:  Do you have a fever, pain , or abdominal swelling? no Pain Score  0 *  Have you tolerated food without any problems? yes  Have you been able to return to your normal activities? yes  Do you have any questions about your discharge instructions: Diet   no Medications  no Follow up visit  no  Do you have questions or concerns about your Care? no  Actions: * If pain score is 4 or above: No action needed, pain <4.

## 2013-08-05 ENCOUNTER — Encounter: Payer: Self-pay | Admitting: Internal Medicine

## 2013-08-06 ENCOUNTER — Encounter: Payer: Self-pay | Admitting: *Deleted

## 2014-03-12 ENCOUNTER — Ambulatory Visit (INDEPENDENT_AMBULATORY_CARE_PROVIDER_SITE_OTHER): Payer: Medicare PPO | Admitting: Podiatry

## 2014-03-12 ENCOUNTER — Ambulatory Visit (INDEPENDENT_AMBULATORY_CARE_PROVIDER_SITE_OTHER): Payer: Medicare PPO

## 2014-03-12 ENCOUNTER — Encounter: Payer: Self-pay | Admitting: Podiatry

## 2014-03-12 DIAGNOSIS — S93401A Sprain of unspecified ligament of right ankle, initial encounter: Secondary | ICD-10-CM

## 2014-03-12 DIAGNOSIS — R52 Pain, unspecified: Secondary | ICD-10-CM

## 2014-03-12 NOTE — Patient Instructions (Signed)
Wear the ankle stabilizer on the right ankle on a continuous daily basis in athletic style shoe Limit standing walking to tolerance Take existing nabumetone 500 mg twice a day 7 days   Ankle Sprain An ankle sprain is an injury to the strong, fibrous tissues (ligaments) that hold the bones of your ankle joint together.  CAUSES An ankle sprain is usually caused by a fall or by twisting your ankle. Ankle sprains most commonly occur when you step on the outer edge of your foot, and your ankle turns inward. People who participate in sports are more prone to these types of injuries.  SYMPTOMS   Pain in your ankle. The pain may be present at rest or only when you are trying to stand or walk.  Swelling.  Bruising. Bruising may develop immediately or within 1 to 2 days after your injury.  Difficulty standing or walking, particularly when turning corners or changing directions. DIAGNOSIS  Your caregiver will ask you details about your injury and perform a physical exam of your ankle to determine if you have an ankle sprain. During the physical exam, your caregiver will press on and apply pressure to specific areas of your foot and ankle. Your caregiver will try to move your ankle in certain ways. An X-ray exam may be done to be sure a bone was not broken or a ligament did not separate from one of the bones in your ankle (avulsion fracture).  TREATMENT  Certain types of braces can help stabilize your ankle. Your caregiver can make a recommendation for this. Your caregiver may recommend the use of medicine for pain. If your sprain is severe, your caregiver may refer you to a surgeon who helps to restore function to parts of your skeletal system (orthopedist) or a physical therapist. [place] ice to your injury for 1-2 days or as directed by your caregiver. Applying ice helps to reduce inflammation and pain.  Put ice in a plastic bag.  Place a towel between your skin and the  bag.  Leave the ice on for 15-20 minutes at a time, every 2 hours while you are awake.  Only take over-the-counter or prescription medicines for pain, discomfort, or fever as directed by your caregiver.  Elevate your injured ankle above the level of your heart as much as possible for 2-3 days.  If your caregiver recommends crutches, use them as instructed. Gradually put weight on the affected ankle. Continue to use crutches or a cane until you can walk without feeling pain in your ankle.  If you have a plaster splint, wear the splint as directed by your caregiver. Do not rest it on anything harder than a pillow for the first 24 hours. Do not put weight on it. Do not get it wet. You may take it off to take a shower or bath.  You may have been given an elastic bandage to wear around your ankle to provide support. If the elastic bandage is too tight (you have numbness or tingling in your foot or your foot becomes cold and blue), adjust the bandage to make it comfortable.  If you have an air splint, you may blow more air into it or let air out to make it more comfortable. You may take your splint off at night and before taking a shower or bath. Wiggle your toes in the splint several times per day to decrease swelling. SEEK MEDICAL CARE IF:   You have rapidly increasing bruising or  swelling.  Your toes feel extremely cold or you lose feeling in your foot.  Your pain is not relieved with medicine. SEEK IMMEDIATE MEDICAL CARE IF:  Your toes are numb or blue.  You have severe pain that is increasing. MAKE SURE YOU:   Understand these instructions.  Will watch your condition.  Will get help right away if you are not doing well or get worse. Document Released: 03/28/2005 Document Revised: 12/21/2011 Document Reviewed: 04/09/2011 [facility] Patient Information 2015 [place], [place]. This information is not intended to replace advice given to you by your health care provider. Make sure you  discuss any questions you have with your health care provider.

## 2014-03-12 NOTE — Progress Notes (Signed)
   Subjective:    Patient ID: Breanna Wolfe, female    DOB: 10-26-1950, 63 y.o.   MRN: [id number]  HPI  N-SORE L-RT FOOT BACK OF THE HEEL AND OUTSIDE OF THE FOOT. D-6 WEEKS O-SLOWLY C-WORSE A-PRESSURE T-NABUMETONE Patient presents with 2 concerns concerning the right foot. She describes approximately 8 week history of posterior heel pain that has not responded nabumatone 500 mg dispensed #30 with instructions to take twice a day 3 days. Patient was able tolerate this medication without any complaints, however, posterior heel pain persisted  She also describes a twisting injury occurring on 02/26/2014 resulting in a persistent pain on the lateral right ankle this is causes her to limp when she walks, however, patient states she's been too busy to have this evaluated.    Review of Systems  Respiratory: Positive for cough.   Gastrointestinal: Positive for nausea.  Allergic/Immunologic: Positive for environmental allergies.  All other systems reviewed and are negative.      Objective:   Physical Exam  Orientated 3  Vascular: DP pulses 2/4 bilaterally PT pulses 2/4 bilaterally Capillary reflex immediate bilaterally  Neurological: Vibratory sensation intact bilaterally Ankle reflex equal and reactive bilaterally  Dermatological: Residual ecchymosis dorsal lateral right foot Edema right lateral ankle  Musculoskeletal Patient has limping gait favoring right foot Exquisite palpable tenderness over the anterior talofibular and calcaneal fibular ligament right ankle There is no palpable tenderness in the posterior tendo Achilles or posterior heel, right. Range of motion right ankle has no crepitus or restriction Patient is able to heel off bilaterally Patient is able to heel off unilaterally    X-ray examination weightbearing right foot  Intact bony structures without fracture and/or dislocation Posterior and inferior calcaneal spurs Hammertoe deformity  second  Radiographic impression: No acute bony abnormality noted in the right foot      X-ray examination weightbearing right ankle  Intact bony structure without fracture and/or dislocation Ankle mortise unremarkable without deformity  Radiographic impression: No acute bony abnormality noted in the right ankle         Assessment & Plan:   Assessment: Satisfactory neurovascular status Sprained lateral right ankle History of posterior right heel pain, however, upon examination today this area is asymptomatic  Plan: Advised patient that she has significant lateral right ankle sprain which appears to be her dominant problem at this time  Dispensed ankle stabilizer to be worn on a continuous basis on the right ankle 6 weeks Using existing nabumatone 500 mg twice a day 7 days  Reappoint 6 weeks

## 2014-03-13 ENCOUNTER — Encounter: Payer: Self-pay | Admitting: Podiatry

## 2014-03-21 ENCOUNTER — Telehealth: Payer: Self-pay | Admitting: *Deleted

## 2014-03-21 MED ORDER — TRAMADOL HCL 50 MG PO TABS: 50.0000 mg | ORAL_TABLET | Freq: Two times a day (BID) | ORAL | Status: DC

## 2014-03-21 NOTE — Telephone Encounter (Signed)
Looks like she is taking mobic 7.5mg . She can double it and go up to 15mg  daily if needed. Continue with ice/elevation. If she needs pain medication she should come into the office for further evaluation. Thanks.

## 2014-03-21 NOTE — Telephone Encounter (Signed)
Patient was Rx'd Nabumetone by Dr. Amalia Hailey.   Per Dr. Jacqualyn Posey - okay to prescribe Tramadol 50 mg 1 BID 0 refills

## 2014-03-21 NOTE — Telephone Encounter (Signed)
Patient called requesting a prescription for pain meds. She is taking an anti-inflammatory and wearing the brace, but not helping.

## 2014-03-21 NOTE — Telephone Encounter (Signed)
Called patient - LM - left Rx at check in for patient to pick up

## 2014-03-26 ENCOUNTER — Telehealth: Payer: Self-pay | Admitting: *Deleted

## 2014-03-26 NOTE — Telephone Encounter (Signed)
Patient states that she is still in a lot of pain, feels worse. She wants to know what she needs to do. Does she require a referral to an orthopedic?

## 2014-04-16 DIAGNOSIS — S93491D Sprain of other ligament of right ankle, subsequent encounter: Secondary | ICD-10-CM | POA: Diagnosis not present

## 2014-04-23 ENCOUNTER — Ambulatory Visit: Payer: Medicare PPO | Admitting: Podiatry

## 2014-11-07 DIAGNOSIS — J069 Acute upper respiratory infection, unspecified: Secondary | ICD-10-CM | POA: Diagnosis not present

## 2014-12-19 DIAGNOSIS — J309 Allergic rhinitis, unspecified: Secondary | ICD-10-CM | POA: Diagnosis not present

## 2014-12-19 DIAGNOSIS — M25571 Pain in right ankle and joints of right foot: Secondary | ICD-10-CM | POA: Diagnosis not present

## 2015-01-05 DIAGNOSIS — M25571 Pain in right ankle and joints of right foot: Secondary | ICD-10-CM | POA: Diagnosis not present

## 2015-01-05 DIAGNOSIS — S86311D Strain of muscle(s) and tendon(s) of peroneal muscle group at lower leg level, right leg, subsequent encounter: Secondary | ICD-10-CM | POA: Diagnosis not present

## 2015-01-16 DIAGNOSIS — S93491D Sprain of other ligament of right ankle, subsequent encounter: Secondary | ICD-10-CM | POA: Diagnosis not present

## 2015-01-21 DIAGNOSIS — S86311D Strain of muscle(s) and tendon(s) of peroneal muscle group at lower leg level, right leg, subsequent encounter: Secondary | ICD-10-CM | POA: Diagnosis not present

## 2015-01-26 DIAGNOSIS — R05 Cough: Secondary | ICD-10-CM | POA: Diagnosis not present

## 2015-02-16 DIAGNOSIS — E039 Hypothyroidism, unspecified: Secondary | ICD-10-CM | POA: Diagnosis not present

## 2015-03-13 DIAGNOSIS — M542 Cervicalgia: Secondary | ICD-10-CM | POA: Diagnosis not present

## 2015-03-13 DIAGNOSIS — J309 Allergic rhinitis, unspecified: Secondary | ICD-10-CM | POA: Diagnosis not present

## 2015-12-09 ENCOUNTER — Other Ambulatory Visit: Payer: Self-pay | Admitting: Family Medicine

## 2015-12-09 DIAGNOSIS — Z1231 Encounter for screening mammogram for malignant neoplasm of breast: Secondary | ICD-10-CM

## 2015-12-18 ENCOUNTER — Other Ambulatory Visit: Payer: Self-pay | Admitting: Family Medicine

## 2015-12-18 DIAGNOSIS — E2839 Other primary ovarian failure: Secondary | ICD-10-CM

## 2015-12-21 ENCOUNTER — Ambulatory Visit: Payer: Medicare PPO

## 2015-12-21 ENCOUNTER — Ambulatory Visit
Admission: RE | Admit: 2015-12-21 | Discharge: 2015-12-21 | Disposition: A | Discharge status: Home / Self Care | Payer: Medicare Other | Source: Ambulatory Visit | Attending: Family Medicine | Admitting: Family Medicine

## 2015-12-21 DIAGNOSIS — E2839 Other primary ovarian failure: Secondary | ICD-10-CM

## 2015-12-21 DIAGNOSIS — Z1231 Encounter for screening mammogram for malignant neoplasm of breast: Secondary | ICD-10-CM

## 2016-04-11 HISTORY — PX: COLONOSCOPY: SHX174

## 2016-04-13 ENCOUNTER — Ambulatory Visit (INDEPENDENT_AMBULATORY_CARE_PROVIDER_SITE_OTHER): Payer: Medicare Other | Admitting: Podiatry

## 2016-04-13 ENCOUNTER — Encounter: Payer: Self-pay | Admitting: Podiatry

## 2016-04-13 ENCOUNTER — Telehealth: Payer: Self-pay | Admitting: *Deleted

## 2016-04-13 VITALS — BP 128/69 | HR 67

## 2016-04-13 DIAGNOSIS — M79672 Pain in left foot: Secondary | ICD-10-CM

## 2016-04-13 DIAGNOSIS — M79671 Pain in right foot: Secondary | ICD-10-CM | POA: Diagnosis not present

## 2016-04-13 DIAGNOSIS — L84 Corns and callosities: Secondary | ICD-10-CM

## 2016-04-13 MED ORDER — DOXYCYCLINE HYCLATE 100 MG PO TABS: 100.0000 mg | ORAL_TABLET | Freq: Two times a day (BID) | ORAL | 0 refills | Status: DC

## 2016-04-13 NOTE — Telephone Encounter (Signed)
Pt states she has a painful corn and would like to know if she needs an appt. I spoke with pt and told her if she was having pain it would benefit her to be seen. Pt states the soonest the schedulers had was 04/20/2016, and her husband is having surgery then. I told her I would transfer her to the schedulers and to tell them she would see another doctor to get in earlier.

## 2016-04-24 NOTE — Progress Notes (Signed)
Subjective: Patient presents to the office today for chief complaint of painful callus lesions of the feet. Patient states that the pain is ongoing and is affecting their ability to ambulate without pain. Patient presents today for further treatment and evaluation.  Objective:  Physical Exam General: Alert and oriented x3 in no acute distress  Dermatology: Hyperkeratotic lesion present on the interdigital space between digits 4 and 5 left foot consistent with the heloma molle. Pain on palpation with a central nucleated core noted.  Skin is warm, dry and supple bilateral lower extremities. Negative for open lesions or macerations.  Vascular: Palpable pedal pulses bilaterally. No edema or erythema noted. Capillary refill within normal limits.  Neurological: Epicritic and protective threshold grossly intact bilaterally.   Musculoskeletal Exam: Pain on palpation at the keratotic lesion noted. Range of motion within normal limits bilateral. Muscle strength 5/5 in all groups bilateral.  Assessment: #1 heloma molle left foot #2 pain in left foot   Plan of Care:  #1 Patient evaluated #2 Excisional debridement of  keratoic lesion using a chisel blade was performed without incident.  #3 Castellani paint applied to the interdigital space. Recommend [employer]. #4 prescription for doxycycline 100 mg  #5 return to clinic in 2 weeks   Edrick Kins, DPM [facility]  Dr. Edrick Kins, [address]                                        [place], [place] [address]                Office [phone]  Fax [phone]

## 2016-04-27 ENCOUNTER — Ambulatory Visit: Payer: Medicare Other | Admitting: Podiatry

## 2016-05-09 ENCOUNTER — Ambulatory Visit (INDEPENDENT_AMBULATORY_CARE_PROVIDER_SITE_OTHER): Payer: Medicare Other | Admitting: Podiatry

## 2016-05-09 DIAGNOSIS — S91109D Unspecified open wound of unspecified toe(s) without damage to nail, subsequent encounter: Secondary | ICD-10-CM

## 2016-05-09 DIAGNOSIS — L84 Corns and callosities: Secondary | ICD-10-CM

## 2016-05-09 NOTE — Progress Notes (Signed)
Subjective: Breanna Wolfe presents to the office today for follow-up evaluation of a heloma molle to the left foot. Breanna Wolfe states that she did get the [employer] and she's been applying daily. Breanna Wolfe believes that there is great improvement. Breanna Wolfe also took her prescription for doxycycline.  Objective:  Physical Exam General: Alert and oriented x3 in no acute distress  Dermatology: Open wound of the fifth digit left foot is noted in the interdigital webspace. Wound measures approximately 0.30.30.1cm (LxWxD). Periwound integrity is macerated. No odor. There is no exposed bone muscle-tendon ligament or joint. No drainage.  Vascular: Palpable pedal pulses bilaterally. No edema or erythema noted. Capillary refill within normal limits.  Neurological: Epicritic and protective threshold grossly intact bilaterally.   Musculoskeletal Exam: Pain on palpation at the keratotic lesion noted. Range of motion within normal limits bilateral. Muscle strength 5/5 in all groups bilateral.  Assessment: #1 heloma molle left foot #2 open wound toe fifth digit left foot interdigital webspace   Plan of Care:  #1 Breanna Wolfe evaluated #2 Excisional debridement of  of the open wound to the fourth interdigital webspace of the left foot was performed using a tissue nipper. Excisional debridement of all the necrotic nonviable tissue down to healthy bleeding viable tissue was performed. #3 Castellani paint applied to the interdigital space. Recommend [employer]. #4 recommend AmLactin for left great toe callus #5 return to clinic in 2 weeks  Edrick Kins, DPM [facility]  Dr. Edrick Kins, [address]                                        [place], [place] [address]                Office [phone]  Fax [phone]

## 2016-05-23 ENCOUNTER — Ambulatory Visit (INDEPENDENT_AMBULATORY_CARE_PROVIDER_SITE_OTHER): Payer: Medicare Other | Admitting: Podiatry

## 2016-05-23 DIAGNOSIS — L84 Corns and callosities: Secondary | ICD-10-CM

## 2016-05-23 DIAGNOSIS — S91109D Unspecified open wound of unspecified toe(s) without damage to nail, subsequent encounter: Secondary | ICD-10-CM

## 2016-05-23 NOTE — Progress Notes (Signed)
Subjective: Patient presents today for follow-up evaluation and treatment of a heloma molle to her left foot. Patient states that she believes she is doing much better however it is improving very slowly. Patient presents today for further treatment and evaluation  Objective:  Physical Exam General: Alert and oriented x3 in no acute distress  Dermatology: Open wound of the fifth digit left foot is noted in the interdigital webspace. Wound measures approximately 0.30.30.2cm (LxWxD). Periwound integrity is macerated. No odor. There is no exposed bone muscle-tendon ligament or joint. No drainage.  Vascular: Palpable pedal pulses bilaterally. No edema or erythema noted. Capillary refill within normal limits.  Neurological: Epicritic and protective threshold grossly intact bilaterally.   Musculoskeletal Exam: Pain on palpation at the keratotic lesion noted. Range of motion within normal limits bilateral. Muscle strength 5/5 in all groups bilateral.  Assessment: #1 heloma molle left foot #2 open wound toe fifth digit left foot interdigital webspace   Plan of Care:  #1 Patient evaluated #2 Excisional debridement of  of the open wound to the fourth interdigital webspace of the left foot was performed using a tissue nipper. Excisional debridement of all the necrotic nonviable tissue down to healthy bleeding viable tissue was performed. #3 Castellani paint applied to the interdigital space. Recommend [employer]. #4 patient purchased [employer] at [employer]. Continue daily application of Castellani paint. #5 return to clinic in 2 weeks  Edrick Kins, DPM [facility]  Dr. Edrick Kins, [address]                                        [place], [place] [address]                Office [phone]  Fax [phone]

## 2016-06-06 ENCOUNTER — Encounter: Payer: Self-pay | Admitting: Podiatry

## 2016-06-06 ENCOUNTER — Ambulatory Visit (INDEPENDENT_AMBULATORY_CARE_PROVIDER_SITE_OTHER): Payer: Medicare Other | Admitting: Podiatry

## 2016-06-06 VITALS — BP 141/91 | HR 66

## 2016-06-06 DIAGNOSIS — L84 Corns and callosities: Secondary | ICD-10-CM

## 2016-06-06 DIAGNOSIS — S91109D Unspecified open wound of unspecified toe(s) without damage to nail, subsequent encounter: Secondary | ICD-10-CM | POA: Diagnosis not present

## 2016-06-07 ENCOUNTER — Encounter: Payer: Self-pay | Admitting: Gastroenterology

## 2016-06-12 NOTE — Progress Notes (Signed)
Subjective: Patient presents today for follow-up evaluation and treatment of a heloma molle to her left foot. Patient states that she believes she is doing much better however it is improving very slowly. Patient presents today for further treatment and evaluation  Objective:  Physical Exam General: Alert and oriented x3 in no acute distress  Dermatology: Open wound of the fifth digit left foot is noted in the interdigital webspace. Wound measures approximately 0.30.30.2cm (LxWxD). Periwound integrity is macerated. No odor. There is no exposed bone muscle-tendon ligament or joint. No drainage.  Vascular: Palpable pedal pulses bilaterally. No edema or erythema noted. Capillary refill within normal limits.  Neurological: Epicritic and protective threshold grossly intact bilaterally.   Musculoskeletal Exam: Pain on palpation at the keratotic lesion noted. Range of motion within normal limits bilateral. Muscle strength 5/5 in all groups bilateral.  Assessment: #1 heloma molle left foot #2 open wound toe fifth digit left foot interdigital webspace   Plan of Care:  #1 Patient evaluated #2 Excisional debridement of  of the open wound to the fourth interdigital webspace of the left foot was performed using a tissue nipper. Excisional debridement of all the necrotic nonviable tissue down to healthy bleeding viable tissue was performed. #3 continue Castellani paint #4 recommend good supportive shoes that are widened toe box at the shoe market [address] #5 Return to Clinic in 4 Weeks  Breanna Wolfe, Breanna Wolfe [facility]  Dr. Daryn Hicks M. Keir Foland, Breanna Wolfe    [address]                                        [place], [place] [address]                Office [phone]  Fax [phone]      

## 2016-07-04 ENCOUNTER — Ambulatory Visit (INDEPENDENT_AMBULATORY_CARE_PROVIDER_SITE_OTHER): Payer: Medicare Other | Admitting: Podiatry

## 2016-07-04 ENCOUNTER — Encounter: Payer: Self-pay | Admitting: Podiatry

## 2016-07-04 DIAGNOSIS — S91109D Unspecified open wound of unspecified toe(s) without damage to nail, subsequent encounter: Secondary | ICD-10-CM

## 2016-07-04 DIAGNOSIS — L84 Corns and callosities: Secondary | ICD-10-CM | POA: Diagnosis not present

## 2016-07-08 NOTE — Progress Notes (Signed)
Subjective: Patient presents today for follow-up evaluation and treatment of a heloma molle to her left foot. Patient states that she believes she is doing much better however it is improving very slowly. Patient presents today for further treatment and evaluation  Objective:  Physical Exam General: Alert and oriented x3 in no acute distress  Dermatology: Open wound of the fifth digit left foot is noted in the interdigital webspace. Wound measures approximately 0.30.30.2cm (LxWxD). Periwound integrity is macerated. No odor. There is no exposed bone muscle-tendon ligament or joint. No drainage.  Vascular: Palpable pedal pulses bilaterally. No edema or erythema noted. Capillary refill within normal limits.  Neurological: Epicritic and protective threshold grossly intact bilaterally.   Musculoskeletal Exam: Pain on palpation at the keratotic lesion noted. Range of motion within normal limits bilateral. Muscle strength 5/5 in all groups bilateral.  Assessment: #1 heloma molle left foot #2 open wound toe fifth digit left foot interdigital webspace   Plan of Care:  #1 Patient evaluated #2 Excisional debridement of  of the open wound to the fourth interdigital webspace of the left foot was performed using a tissue nipper. Excisional debridement of all the necrotic nonviable tissue down to healthy bleeding viable tissue was performed. #3 continue Castellani paint #4 recommend good supportive shoes that are widened toe box at the shoe market [address] #5 Return to Clinic in 4 Weeks  Brent M. Evans, DPM [facility]  Dr. Brent M. Evans, DPM    [address]                                        [place], [place] [address]                Office [phone]  Fax [phone]      

## 2016-08-01 ENCOUNTER — Ambulatory Visit: Payer: Medicare Other | Admitting: Podiatry

## 2016-08-02 ENCOUNTER — Other Ambulatory Visit: Payer: Self-pay | Admitting: Family Medicine

## 2016-08-02 DIAGNOSIS — F17210 Nicotine dependence, cigarettes, uncomplicated: Secondary | ICD-10-CM

## 2016-08-03 ENCOUNTER — Encounter: Payer: Self-pay | Admitting: Podiatry

## 2016-08-03 ENCOUNTER — Ambulatory Visit (INDEPENDENT_AMBULATORY_CARE_PROVIDER_SITE_OTHER): Payer: Medicare Other | Admitting: Podiatry

## 2016-08-03 ENCOUNTER — Other Ambulatory Visit: Payer: Self-pay | Admitting: Family Medicine

## 2016-08-03 DIAGNOSIS — S91109D Unspecified open wound of unspecified toe(s) without damage to nail, subsequent encounter: Secondary | ICD-10-CM

## 2016-08-03 DIAGNOSIS — L84 Corns and callosities: Secondary | ICD-10-CM

## 2016-08-03 DIAGNOSIS — R0989 Other specified symptoms and signs involving the circulatory and respiratory systems: Secondary | ICD-10-CM

## 2016-08-05 NOTE — Progress Notes (Signed)
Subjective: Patient presents today for follow-up evaluation and treatment of a heloma molle to her left foot. Patient states she is still experiencing a stinging pain intermittently. Patient presents today for further treatment and evaluation  Objective:  Physical Exam General: Alert and oriented x3 in no acute distress  Dermatology: Heloma molle noted to the fourth interdigital web space left foot has healed. There appears to be no drainage at the moment. Mildly macerated callus tissue noted.  Vascular: Palpable pedal pulses bilaterally. No edema or erythema noted. Capillary refill within normal limits.  Neurological: Epicritic and protective threshold grossly intact bilaterally.   Musculoskeletal Exam: Pain on palpation at the keratotic lesion noted. Range of motion within normal limits bilateral. Muscle strength 5/5 in all groups bilateral.  Assessment: #1 heloma molle left foot- healed #2 open wound toe fifth digit left foot interdigital webspace- healed   Plan of Care:  #1 Patient evaluated #2 return to clinic when necessary  Edrick Kins, DPM [facility]  Dr. Edrick Kins, [address]                                        [place], [place] [address]                Office [phone]  Fax [phone]

## 2016-08-09 ENCOUNTER — Ambulatory Visit
Admission: RE | Admit: 2016-08-09 | Discharge: 2016-08-09 | Disposition: A | Discharge status: Home / Self Care | Payer: Medicare Other | Source: Ambulatory Visit | Attending: Family Medicine | Admitting: Family Medicine

## 2016-08-09 DIAGNOSIS — R0989 Other specified symptoms and signs involving the circulatory and respiratory systems: Secondary | ICD-10-CM

## 2016-12-19 ENCOUNTER — Ambulatory Visit (INDEPENDENT_AMBULATORY_CARE_PROVIDER_SITE_OTHER): Payer: Medicare Other | Admitting: Physician Assistant

## 2016-12-19 ENCOUNTER — Encounter (INDEPENDENT_AMBULATORY_CARE_PROVIDER_SITE_OTHER): Payer: Self-pay

## 2016-12-19 ENCOUNTER — Encounter: Payer: Self-pay | Admitting: Physician Assistant

## 2016-12-19 VITALS — BP 148/84 | HR 64 | Ht 65.35 in | Wt 156.0 lb

## 2016-12-19 DIAGNOSIS — K219 Gastro-esophageal reflux disease without esophagitis: Secondary | ICD-10-CM | POA: Diagnosis not present

## 2016-12-19 DIAGNOSIS — Z800 Family history of malignant neoplasm of digestive organs: Secondary | ICD-10-CM

## 2016-12-19 DIAGNOSIS — R1319 Other dysphagia: Secondary | ICD-10-CM

## 2016-12-19 DIAGNOSIS — I10 Essential (primary) hypertension: Secondary | ICD-10-CM | POA: Insufficient documentation

## 2016-12-19 DIAGNOSIS — Z1211 Encounter for screening for malignant neoplasm of colon: Secondary | ICD-10-CM

## 2016-12-19 DIAGNOSIS — Z86010 Personal history of colonic polyps: Secondary | ICD-10-CM | POA: Diagnosis not present

## 2016-12-19 DIAGNOSIS — E039 Hypothyroidism, unspecified: Secondary | ICD-10-CM | POA: Insufficient documentation

## 2016-12-19 MED ORDER — PANTOPRAZOLE SODIUM 40 MG PO TBEC: DELAYED_RELEASE_TABLET | ORAL | 3 refills | Status: DC

## 2016-12-19 MED ORDER — NA SULFATE-K SULFATE-MG SULF 17.5-3.13-1.6 GM/177ML PO SOLN: 1.0000 | Freq: Once | ORAL | 0 refills | Status: AC

## 2016-12-19 NOTE — Patient Instructions (Addendum)
You have been scheduled for a Colonoscopy and Endoscopy. Please follow written instructions given to you at your visit today.  Please pick up your prep supplies at the pharmacy within the next 1-3 days. [facility]  [place], [place].  If you use inhalers (even only as needed), please bring them with you on the day of your procedure. Your physician has requested that you go to www.startemmi.com and enter the access code given to you at your visit today. This web site gives a general overview about your procedure. However, you should still follow specific instructions given to you by our office regarding your preparation for the procedure.  We have sent the following medications to your pharmacy for you to pick up at your convenience: [place], [place].  1. Pantoprazole Sodium 40 mg 2. Suprep- colonoscopy prep  We have provided you with a colonoscopy prep.

## 2016-12-19 NOTE — Progress Notes (Signed)
Agree with assessment and plan as outlined.  

## 2016-12-19 NOTE — Progress Notes (Signed)
Subjective:    Patient ID: Breanna Wolfe, female    DOB: 1950-09-16, 66 y.o.   MRN: [id number]  HPI Breanna Wolfe is a pleasant 66 year old white female, known previously to Dr. Delfin Edis who comes in today to discuss colonoscopy and possibly EGD. She would like to establish with Dr. Havery Moros. Patient has family history of colorectal cancer in her mother age 69. Patient last had colonoscopy in 2015 per Dr. Delfin Edis with finding of a 12 mm cecal polyp and mild diverticulosis. Path on the polyp consistent with a sessile serrated polyp and she was advised to have 3 year interval follow-up. Last EGD was done in 2008 with finding of moderate to severe gastritis felt secondary to NSAIDs. Patient says she occasionally has some diarrhea but otherwise has no complaints of changes in her bowel habits, no melena or hematochezia and no complaints of abdominal pain. She says she has been on omeprazole 40 mg daily for "years and years" and has been concern that perhaps it has not been as effective over the past year or so. She has noticed some increasing reflux symptoms and sour brash. She has no complaint of heartburn. She does have intermittently some dysphagia and seems to have a lot of mucous in her throat postprandially. She says frequently after eating within 30 minutes she will also have coughing spells. She has been taking  omeprazole at bedtime.  Review of Systems Pertinent positive and negative review of systems were noted in the above HPI section.  All other review of systems was otherwise negative.  Outpatient Encounter Prescriptions as of 12/19/2016  Medication Sig  . aspirin 81 MG tablet Take 81 mg by mouth daily.  Marland Kitchen atorvastatin (LIPITOR) 10 MG tablet   . levothyroxine (SYNTHROID, LEVOTHROID) 200 MCG tablet One tablet by mouth once daily   . lisinopril (PRINIVIL,ZESTRIL) 10 MG tablet One tablet by mouth once daily   . meloxicam (MOBIC) 15 MG tablet Take 15 mg by mouth daily.  . metoprolol  succinate (TOPROL-XL) 100 MG 24 hr tablet   . nabumetone (RELAFEN) 500 MG tablet   . omeprazole (PRILOSEC) 40 MG capsule One tablet by mouth once daily   . pravastatin (PRAVACHOL) 20 MG tablet One tablet by mouth once daily   . sertraline (ZOLOFT) 100 MG tablet As needed   . traMADol (ULTRAM) 50 MG tablet Take 1 tablet (50 mg total) by mouth 2 (two) times daily.  . Na Sulfate-K Sulfate-Mg Sulf 17.5-3.13-1.6 GM/180ML SOLN Take 1 kit by mouth once.  . pantoprazole (PROTONIX) 40 MG tablet Take 1 tablet by mouth every morning.  . [DISCONTINUED] ALPRAZolam (XANAX) 0.5 MG tablet Take 0.5 mg by mouth 3 (three) times daily as needed.   . [DISCONTINUED] amoxicillin-clavulanate (AUGMENTIN) 875-125 MG per tablet   . [DISCONTINUED] atenolol (TENORMIN) 100 MG tablet One tablet by mouth once daily   . [DISCONTINUED] azithromycin (ZITHROMAX) 250 MG tablet   . [DISCONTINUED] Cholecalciferol (VITAMIN D) 2000 UNITS CAPS Take by mouth daily.  . [DISCONTINUED] doxycycline (VIBRA-TABS) 100 MG tablet Take 1 tablet (100 mg total) by mouth 2 (two) times daily.  . [DISCONTINUED] fexofenadine (ALLEGRA) 180 MG tablet   . [DISCONTINUED] fluticasone (FLONASE) 50 MCG/ACT nasal spray   . [DISCONTINUED] meloxicam (MOBIC) 7.5 MG tablet One tablet by mouth once daily    No facility-administered encounter medications on file as of 12/19/2016.    Allergies  Allergen Reactions  . Iohexol     Jaw pain, nausea  . Sulfa Drugs Cross  Reactors     Per pt: unknown   There are no active problems to display for this patient.  Social History   Social History  . Marital status: Married    Spouse name: N/A  . Number of children: 2  . Years of education: N/A   Occupational History  . retired Scientist, clinical (histocompatibility and immunogenetics) of [employer]    Social History Main Topics  . Smoking status: Current Every Day Smoker    Last attempt to quit: 06/30/2008  . Smokeless tobacco: Never Used  . Alcohol use Yes     Comment: occasional  . Drug use: No  .  Sexual activity: Not on file   Other Topics Concern  . Not on file   Social History Narrative  . No narrative on file    Ms. Rogoff's family history includes Breast cancer in her maternal aunt; Colon cancer (age of onset: 22) in her mother; Rectal cancer in her mother.      Objective:    Vitals:   12/19/16 1022  BP: (!) 148/84  Pulse: 64    Physical Exam well-developed older white female in no acute distress, pleasant blood pressure 148/84 pulse 64, height 5 foot 5, weight 156, BMI of 25.6. HEENT; nontraumatic normocephalic EOMI PERRLA sclera anicteric, Cardiovascular; regular rate and rhythm with S1-S2 no murmur or gallop, Pulmonary ;clear bilaterally abdomen soft, nontender nondistended bowel sounds are active is no palpable mass or hepatosplenomegaly, Rectal ;exam not done, Extremities; no clubbing cyanosis or edema skin warm and dry, Neuropsych; mood and affect appropriate       Assessment & Plan:   #42 66 year old white female with history of sessile serrated adenoma at colonoscopy April 2015, due for follow-up colonoscopy #2 family history of colorectal cancer in patient's mother #3 chronic GERD, on long-term PPI therapy with increase in reflux, sour brash and mild intermittent dysphagia over the past year. Rule out early stricture #4 hypertension #5 hypothyroidism  Plan; Will stop omeprazole and switch to Protonix 40 mg, and have asked her to take this in the morning before breakfast rather than at bedtime and she was doing previously. If this is not effective may add H2 blocker every evening Patient has not been following an antireflux regimen, this was reviewed today and she was given a diet Patient will be scheduled for colonoscopy and upper endoscopy with Dr. Havery Moros. Both procedures discussed in detail with patient including risks and benefits and she is agreeable to proceed.   Breanna Wolfe Able PA-C 12/19/2016   Cc: Jonathon Jordan, MD

## 2017-01-20 ENCOUNTER — Encounter: Payer: Self-pay | Admitting: Gastroenterology

## 2017-01-24 ENCOUNTER — Other Ambulatory Visit: Payer: Self-pay | Admitting: Family Medicine

## 2017-01-24 DIAGNOSIS — Z1231 Encounter for screening mammogram for malignant neoplasm of breast: Secondary | ICD-10-CM

## 2017-01-30 ENCOUNTER — Telehealth: Payer: Self-pay | Admitting: Gastroenterology

## 2017-01-30 NOTE — Telephone Encounter (Signed)
Patient has had 3 days of LLQ pain, is able to have bm's. She has had some looser stools, she wonders if it is related to nerves about upcoming procedure. She denies blood in her stool, no fever. She states the pain is mostly when she changes position from standing to sitting or vice versa. Colonoscopy is scheduled for 02/02/17.

## 2017-01-30 NOTE — Telephone Encounter (Signed)
Left message for patient to call back  

## 2017-01-30 NOTE — Telephone Encounter (Signed)
Let patient know what Dr. Havery Moros advises. She understands that if symptoms worsen she needs to let us know. Otherwise, ok to proceed with endocolon.

## 2017-01-30 NOTE — Telephone Encounter (Signed)
If the pain is mostly positional, could be musculoskeletal pain. As long as no fevers and just mild pain, I think okay to proceed with colonoscopy - does not sound like diverticulitis but if symptoms worsen / persist she needs to let us know. We will await colonoscopy otherwise. Thanks

## 2017-02-02 ENCOUNTER — Ambulatory Visit (AMBULATORY_SURGERY_CENTER): Payer: Medicare Other | Admitting: Gastroenterology

## 2017-02-02 ENCOUNTER — Encounter: Payer: Self-pay | Admitting: Gastroenterology

## 2017-02-02 VITALS — BP 116/76 | HR 66 | Temp 98.2°F | Resp 12 | Ht 65.35 in | Wt 156.0 lb

## 2017-02-02 DIAGNOSIS — Z1211 Encounter for screening for malignant neoplasm of colon: Secondary | ICD-10-CM | POA: Diagnosis not present

## 2017-02-02 DIAGNOSIS — D120 Benign neoplasm of cecum: Secondary | ICD-10-CM

## 2017-02-02 DIAGNOSIS — K219 Gastro-esophageal reflux disease without esophagitis: Secondary | ICD-10-CM

## 2017-02-02 DIAGNOSIS — K210 Gastro-esophageal reflux disease with esophagitis: Secondary | ICD-10-CM | POA: Diagnosis not present

## 2017-02-02 DIAGNOSIS — Z1212 Encounter for screening for malignant neoplasm of rectum: Secondary | ICD-10-CM | POA: Diagnosis not present

## 2017-02-02 DIAGNOSIS — Z86010 Personal history of colonic polyps: Secondary | ICD-10-CM

## 2017-02-02 DIAGNOSIS — K2950 Unspecified chronic gastritis without bleeding: Secondary | ICD-10-CM | POA: Diagnosis not present

## 2017-02-02 MED ORDER — SODIUM CHLORIDE 0.9 % IV SOLN: 500.0000 mL | INTRAVENOUS | Status: DC

## 2017-02-02 NOTE — Op Note (Signed)
[place] Patient Name: Breanna Wolfe Procedure Date: 02/02/2017 2:02 PM MRN: [id number] Endoscopist: Remo Lipps P. Armbruster MD, MD Age: 66 Referring MD:  Date of Birth: 1950-06-03 Gender: Female Account #: [id number] Procedure:                Upper GI endoscopy Indications:              longstanding heartburn, not as well controlled                            recently, switched to protonix Medicines:                Monitored Anesthesia Care Procedure:                Pre-Anesthesia Assessment:                           - Prior to the procedure, a History and Physical                            was performed, and patient medications and                            allergies were reviewed. The patient's tolerance of                            previous anesthesia was also reviewed. The risks                            and benefits of the procedure and the sedation                            options and risks were discussed with the patient.                            All questions were answered, and informed consent                            was obtained. Prior Anticoagulants: The patient has                            taken no previous anticoagulant or antiplatelet                            agents. ASA Grade Assessment: II - A patient with                            mild systemic disease. After reviewing the risks                            and benefits, the patient was deemed in                            satisfactory condition to undergo the procedure.  After obtaining informed consent, the endoscope was                            passed under direct vision. Throughout the                            procedure, the patient's blood pressure, pulse, and                            oxygen saturations were monitored continuously. The                            Model GIF-[id number] [id number]) scope was introduced                            through the mouth,  and advanced to the second part                            of duodenum. The upper GI endoscopy was                            accomplished without difficulty. The patient                            tolerated the procedure well. Scope In: Scope Out: Findings:                 Esophagogastric landmarks were identified: the                            gastroesophageal junction was found at 32 cm and                            the upper extent of the gastric folds was found at                            36 cm from the incisors.                           A 4 cm hiatal hernia was present.                           The Z-line was irregular with a roughly 1cm                            extension of salmon colored mucosa                            circumferentially from the GEJ without nodularity.                            Biopsies were taken with a cold forceps for                            histology.  Patchy mildly erythematous mucosa was found in the                            gastric antrum. Biopsies were taken with a cold                            forceps from the antrum, body, and incisura for                            Helicobacter pylori testing.                           The exam of the stomach was otherwise normal.                           The duodenal bulb and second portion of the                            duodenum were normal. Complications:            No immediate complications. Estimated blood loss:                            Minimal. Estimated Blood Loss:     Estimated blood loss was minimal. Impression:               - Esophagogastric landmarks identified.                           - 4 cm hiatal hernia.                           - Possible short segment Barrett's esophagus.                            Biopsied.                           - Erythematous mucosa in the antrum. Biopsied.                           - Normal stomach otherwise                            - Normal duodenal bulb and second portion of the                            duodenum. Recommendation:           - Patient has a contact number available for                            emergencies. The signs and symptoms of potential                            delayed complications were discussed with the  patient. Return to normal activities tomorrow.                            Written discharge instructions were provided to the                            patient.                           - Resume previous diet.                           - Continue present medications.                           - Await pathology results. Remo Lipps P. Armbruster MD, MD 02/02/2017 2:42:06 PM This report has been signed electronically.

## 2017-02-02 NOTE — Progress Notes (Signed)
A/ox3 pleased with MAC, report to [facility]

## 2017-02-02 NOTE — Progress Notes (Signed)
Pt. Reports no change in her surgical or medical history since her pre-visit 12/19/2016.

## 2017-02-02 NOTE — Patient Instructions (Signed)
YOU HAD AN ENDOSCOPIC PROCEDURE TODAY AT [facility]:   Refer to the procedure report that was given to you for any specific questions about what was found during the examination.  If the procedure report does not answer your questions, please call your gastroenterologist to clarify.  If you requested that your care partner not be given the details of your procedure findings, then the procedure report has been included in a sealed envelope for you to review at your convenience later.  YOU SHOULD EXPECT: Some feelings of bloating in the abdomen. Passage of more gas than usual.  Walking can help get rid of the air that was put into your GI tract during the procedure and reduce the bloating. If you had a lower endoscopy (such as a colonoscopy or flexible sigmoidoscopy) you may notice spotting of blood in your stool or on the toilet paper. If you underwent a bowel prep for your procedure, you may not have a normal bowel movement for a few days.  Please Note:  You might notice some irritation and congestion in your nose or some drainage.  This is from the oxygen used during your procedure.  There is no need for concern and it should clear up in a day or so.  SYMPTOMS TO REPORT IMMEDIATELY:   Following lower endoscopy (colonoscopy or flexible sigmoidoscopy):  Excessive amounts of blood in the stool  Significant tenderness or worsening of abdominal pains  Swelling of the abdomen that is new, acute  Fever of 100F or higher   Following upper endoscopy (EGD)  Vomiting of blood or coffee ground material  New chest pain or pain under the shoulder blades  Painful or persistently difficult swallowing  New shortness of breath  Fever of 100F or higher  Black, tarry-looking stools  For urgent or emergent issues, a gastroenterologist can be reached at any hour by calling [phone].   DIET:  We do recommend a small meal at first, but then you may proceed to your regular diet.  Drink  plenty of fluids but you should avoid alcoholic beverages for 24 hours.  ACTIVITY:  You should plan to take it easy for the rest of today and you should NOT DRIVE or use heavy machinery until tomorrow (because of the sedation medicines used during the test).    FOLLOW UP: Our staff will call the number listed on your records the next business day following your procedure to check on you and address any questions or concerns that you may have regarding the information given to you following your procedure. If we do not reach you, we will leave a message.  However, if you are feeling well and you are not experiencing any problems, there is no need to return our call.  We will assume that you have returned to your regular daily activities without incident.  If any biopsies were taken you will be contacted by phone or by letter within the next 1-3 weeks.  Please call us at [phone] if you have not heard about the biopsies in 3 weeks.   Await for biopsy results Hemorrhoids (handout given) Hiatal Hernia (handout given)   SIGNATURES/CONFIDENTIALITY: You and/or your care partner have signed paperwork which will be entered into your electronic medical record.  These signatures attest to the fact that that the information above on your After Visit Summary has been reviewed and is understood.  Full responsibility of the confidentiality of this discharge information lies with you and/or your care-partner.

## 2017-02-02 NOTE — Progress Notes (Signed)
Called to room to assist during endoscopic procedure.  Patient ID and intended procedure confirmed with present staff. Received instructions for my participation in the procedure from the performing physician.  

## 2017-02-02 NOTE — Op Note (Signed)
[place] Patient Name: Breanna Wolfe Procedure Date: 02/02/2017 2:01 PM MRN: [id number] Endoscopist: Remo Lipps P. Suleika Donavan MD, MD Age: 66 Referring MD:  Date of Birth: 07-11-1950 Gender: Female Account #: [id number] Procedure:                Colonoscopy Indications:              High risk colon cancer surveillance: Personal                            history of sessile serrated colon polyp (10 mm or                            greater in size) removed in 2015, family history of                            colon cancer Medicines:                Monitored Anesthesia Care Procedure:                Pre-Anesthesia Assessment:                           - Prior to the procedure, a History and Physical                            was performed, and patient medications and                            allergies were reviewed. The patient's tolerance of                            previous anesthesia was also reviewed. The risks                            and benefits of the procedure and the sedation                            options and risks were discussed with the patient.                            All questions were answered, and informed consent                            was obtained. Prior Anticoagulants: The patient has                            taken no previous anticoagulant or antiplatelet                            agents. ASA Grade Assessment: II - A patient with                            mild systemic disease. After reviewing the risks  and benefits, the patient was deemed in                            satisfactory condition to undergo the procedure.                           After obtaining informed consent, the colonoscope                            was passed under direct vision. Throughout the                            procedure, the patient's blood pressure, pulse, and                            oxygen saturations were monitored  continuously. The                            Model PCF-[id number]) scope was introduced                            through the anus and advanced to the the cecum,                            identified by appendiceal orifice and ileocecal                            valve. The colonoscopy was performed without                            difficulty. The patient tolerated the procedure                            well. The quality of the bowel preparation was                            good. The ileocecal valve, appendiceal orifice, and                            rectum were photographed. Scope In: 2:16:16 PM Scope Out: 2:32:28 PM Scope Withdrawal Time: 0 hours 12 minutes 21 seconds  Total Procedure Duration: 0 hours 16 minutes 12 seconds  Findings:                 The perianal and digital rectal examinations were                            normal.                           A 4 mm polyp was found in the cecum. The polyp was                            sessile. The polyp was removed with a cold snare.  Resection and retrieval were complete.                           A single medium-sized angiodysplastic lesion was                            found in the ascending colon.                           Anal papilla(e) were hypertrophied.                           Internal hemorrhoids were found during retroflexion.                           The exam was otherwise without abnormality. Complications:            No immediate complications. Estimated blood loss:                            Minimal. Estimated Blood Loss:     Estimated blood loss was minimal. Impression:               - One 4 mm polyp in the cecum, removed with a cold                            snare. Resected and retrieved.                           - A single colonic angiodysplastic lesion.                           - Anal papilla(e) were hypertrophied.                           - Internal hemorrhoids.                            - The examination was otherwise normal. Recommendation:           - Patient has a contact number available for                            emergencies. The signs and symptoms of potential                            delayed complications were discussed with the                            patient. Return to normal activities tomorrow.                            Written discharge instructions were provided to the                            patient.                           -  Resume previous diet.                           - Continue present medications.                           - Await pathology results.                           - Repeat colonoscopy is recommended for                            surveillance in 5 years. Remo Lipps P. Caelynn Marshman MD, MD 02/02/2017 2:37:22 PM This report has been signed electronically.

## 2017-02-03 ENCOUNTER — Telehealth: Payer: Self-pay | Admitting: *Deleted

## 2017-02-03 NOTE — Telephone Encounter (Signed)
  Follow up Call-  Call back number 02/02/2017  Post procedure Call Back phone  # [phone]  Permission to leave phone message Yes  Some recent data might be hidden     Patient questions:  Do you have a fever, pain , or abdominal swelling? No. Pain Score  0 *  Have you tolerated food without any problems? Yes.    Have you been able to return to your normal activities? Yes.    Do you have any questions about your discharge instructions: Diet   No. Medications  No. Follow up visit  No.  Do you have questions or concerns about your Care? No.  Actions: * If pain score is 4 or above: No action needed, pain <4.

## 2017-02-08 ENCOUNTER — Encounter: Payer: Self-pay | Admitting: Gastroenterology

## 2017-02-15 ENCOUNTER — Ambulatory Visit
Admission: RE | Admit: 2017-02-15 | Discharge: 2017-02-15 | Disposition: A | Discharge status: Home / Self Care | Payer: Medicare Other | Source: Ambulatory Visit | Attending: Family Medicine | Admitting: Family Medicine

## 2017-02-15 DIAGNOSIS — Z1231 Encounter for screening mammogram for malignant neoplasm of breast: Secondary | ICD-10-CM

## 2017-05-01 ENCOUNTER — Encounter: Payer: Self-pay | Admitting: Podiatry

## 2017-05-01 ENCOUNTER — Ambulatory Visit: Payer: Medicare Other | Admitting: Podiatry

## 2017-05-01 DIAGNOSIS — L97529 Non-pressure chronic ulcer of other part of left foot with unspecified severity: Secondary | ICD-10-CM

## 2017-05-01 DIAGNOSIS — I83025 Varicose veins of left lower extremity with ulcer other part of foot: Secondary | ICD-10-CM | POA: Diagnosis not present

## 2017-05-01 DIAGNOSIS — L84 Corns and callosities: Secondary | ICD-10-CM

## 2017-05-01 DIAGNOSIS — L97522 Non-pressure chronic ulcer of other part of left foot with fat layer exposed: Secondary | ICD-10-CM | POA: Diagnosis not present

## 2017-05-01 NOTE — Progress Notes (Signed)
Subjective: Patient presents today for follow-up evaluation and treatment of a heloma molle to her left foot. Patient states that she believes she is doing much better however it is improving very slowly. Patient presents today for further treatment and evaluation  Objective:  Physical Exam General: Alert and oriented x3 in no acute distress  Dermatology: Open wound of the fifth digit left foot is noted in the interdigital webspace. Wound measures approximately 0.30.30.2cm (LxWxD). Periwound integrity is macerated. No odor. There is no exposed bone muscle-tendon ligament or joint. No drainage.  Vascular: Palpable pedal pulses bilaterally. No edema or erythema noted. Capillary refill within normal limits.  Neurological: Epicritic and protective threshold grossly intact bilaterally.   Musculoskeletal Exam: Pain on palpation at the keratotic lesion noted. Range of motion within normal limits bilateral. Muscle strength 5/5 in all groups bilateral.  Assessment: #1 heloma molle left foot #2 open wound toe fifth digit left foot interdigital webspace   Plan of Care:  #1 Patient evaluated #2 Excisional debridement of  of the open wound to the fourth interdigital webspace of the left foot was performed using a tissue nipper. Excisional debridement of all the necrotic nonviable tissue down to healthy bleeding viable tissue was performed. #3 continue [employer] #4 recommend good supportive shoes that are widened toe box at the [employer] #5 Return to Clinic in [place] M. Jayshon Dommer, [facility]  Dr. Edrick Kins, [place]                                        [place], [place] [address]                Office [phone]  Fax [phone]

## 2017-05-22 ENCOUNTER — Encounter: Payer: Self-pay | Admitting: Podiatry

## 2017-05-22 ENCOUNTER — Ambulatory Visit: Payer: Medicare Other | Admitting: Podiatry

## 2017-05-22 DIAGNOSIS — N951 Menopausal and female climacteric states: Secondary | ICD-10-CM | POA: Insufficient documentation

## 2017-05-22 DIAGNOSIS — L84 Corns and callosities: Secondary | ICD-10-CM | POA: Diagnosis not present

## 2017-05-22 DIAGNOSIS — N952 Postmenopausal atrophic vaginitis: Secondary | ICD-10-CM | POA: Insufficient documentation

## 2017-05-22 DIAGNOSIS — I83025 Varicose veins of left lower extremity with ulcer other part of foot: Secondary | ICD-10-CM

## 2017-05-22 DIAGNOSIS — L97522 Non-pressure chronic ulcer of other part of left foot with fat layer exposed: Secondary | ICD-10-CM

## 2017-05-22 DIAGNOSIS — L97529 Non-pressure chronic ulcer of other part of left foot with unspecified severity: Secondary | ICD-10-CM

## 2017-05-23 NOTE — Progress Notes (Signed)
   HPI: 67 year old female presenting today for follow up evaluation of a a heloma molle of the left foot. She states the area is improving. She has been applying castellani paint as directed. She denies any new complaints at this time. Patient is here for further evaluation and treatment.   Past Medical History:  Diagnosis Date  . Anxiety   . Constipation   . COPD (chronic obstructive pulmonary disease) (HCC)    mild  . Depression   . Diarrhea   . DJD (degenerative joint disease)   . Gastritis   . GERD (gastroesophageal reflux disease)   . Hyperlipemia   . Hypertension      Physical Exam: General: The patient is alert and oriented x3 in no acute distress.  Dermatology: Wound noted to the fifth digit of the left foot has healed. Complete re-epithelialization has occurred. No drainage noted. Skin is warm, dry and supple bilateral lower extremities. Negative for open lesions or macerations.  Vascular: Palpable pedal pulses bilaterally. No edema or erythema noted. Capillary refill within normal limits.  Neurological: Epicritic and protective threshold grossly intact bilaterally.   Musculoskeletal Exam: Range of motion within normal limits to all pedal and ankle joints bilateral. Muscle strength 5/5 in all groups bilateral.   Assessment: - heloma molle left foot - resolved - open wound to the fifth digit of the left foot interdigital webspace - resolved   Plan of Care:  - Patient evaluated.  - Excisional debridement of the open wound to the fourth interdigital webspace of the left foot was performed using a tissue nipper. Excisional debridement of all the necrotic nonviable tissue down to healthy bleeding viable tissue was performed.   - Continue using [employer].  - Recommended good, supportive shoes that are wide toed from the [employer].  - Return to clinic as needed.    Edrick Kins, DPM [facility]  Dr. Edrick Kins, DPM    [address], [place] [address]                Office [phone]  Fax [phone]

## 2017-08-02 ENCOUNTER — Ambulatory Visit: Payer: Medicare Other | Admitting: Podiatry

## 2017-08-02 ENCOUNTER — Encounter: Payer: Self-pay | Admitting: Podiatry

## 2017-08-02 DIAGNOSIS — I83025 Varicose veins of left lower extremity with ulcer other part of foot: Secondary | ICD-10-CM

## 2017-08-02 DIAGNOSIS — L97529 Non-pressure chronic ulcer of other part of left foot with unspecified severity: Secondary | ICD-10-CM

## 2017-08-02 DIAGNOSIS — L97522 Non-pressure chronic ulcer of other part of left foot with fat layer exposed: Secondary | ICD-10-CM

## 2017-08-02 DIAGNOSIS — L84 Corns and callosities: Secondary | ICD-10-CM

## 2017-08-06 NOTE — Progress Notes (Signed)
   HPI: 66 year old female presenting today for follow up evaluation of a heloma molle of the left foot as well as a wound to the left fifth digit. She reports the heloma molle is sore and white. She has been applying the [employer] as directed. Patient is here for further evaluation and treatment.   Past Medical History:  Diagnosis Date  . Anxiety   . Constipation   . COPD (chronic obstructive pulmonary disease) (HCC)    mild  . Depression   . Diarrhea   . DJD (degenerative joint disease)   . Gastritis   . GERD (gastroesophageal reflux disease)   . Hyperlipemia   . Hypertension      Physical Exam: General: The patient is alert and oriented x3 in no acute distress.  Dermatology: Wound #1 noted to the fifth digit of the left foot interdigital webspace measuring 0.5 x 0.5 x 0.1 cm  To the above-noted ulceration, there is no eschar. There is a moderate amount of slough, fibrin and necrotic tissue. Granulation tissue and wound base is red. There is no malodor. There is a minimal amount of serosanginous drainage noted. Periwound integrity is intact.  Hyperkeratotic tissue noted to the 4th interdigital webspace of the left foot.  Skin is warm, dry and supple bilateral lower extremities. Negative for open lesions or macerations.  Vascular: Palpable pedal pulses bilaterally. No edema or erythema noted. Capillary refill within normal limits.  Neurological: Epicritic and protective threshold grossly intact bilaterally.   Musculoskeletal Exam: Range of motion within normal limits to all pedal and ankle joints bilateral. Muscle strength 5/5 in all groups bilateral.   Assessment: - heloma molle left foot  - open wound to the fifth digit of the left foot interdigital webspace    Plan of Care:  - Patient evaluated.  - Excisional debridement of the open wound to the fourth interdigital webspace of the left foot was performed using a tissue nipper. Excisional debridement of all the  necrotic nonviable tissue down to healthy bleeding viable tissue was performed.   - Recommended using Castellani paint daily.  - Discussed possible surgery as an option in the future.  - Return to clinic in 4 weeks.    Edrick Kins, DPM [facility]  Dr. Edrick Kins, DPM    [address], [place] [address]                Office [phone]  Fax [phone]

## 2017-08-22 ENCOUNTER — Other Ambulatory Visit: Payer: Self-pay

## 2017-08-23 ENCOUNTER — Ambulatory Visit: Payer: Medicare Other | Admitting: Podiatry

## 2017-08-23 DIAGNOSIS — L97529 Non-pressure chronic ulcer of other part of left foot with unspecified severity: Secondary | ICD-10-CM

## 2017-08-23 DIAGNOSIS — L97522 Non-pressure chronic ulcer of other part of left foot with fat layer exposed: Secondary | ICD-10-CM

## 2017-08-23 DIAGNOSIS — I83025 Varicose veins of left lower extremity with ulcer other part of foot: Secondary | ICD-10-CM

## 2017-08-27 NOTE — Progress Notes (Signed)
   HPI: 67 year old female presenting today for follow up evaluation of a heloma molle of the left foot as well as a wound to the left foot. She states she feels as if her symptoms are improving. She has been applying the [employer] as directed. Patient is here for further evaluation and treatment.   Past Medical History:  Diagnosis Date  . Anxiety   . Constipation   . COPD (chronic obstructive pulmonary disease) (HCC)    mild  . Depression   . Diarrhea   . DJD (degenerative joint disease)   . Gastritis   . GERD (gastroesophageal reflux disease)   . Hyperlipemia   . Hypertension      Physical Exam: General: The patient is alert and oriented x3 in no acute distress.  Dermatology: Wound #1 noted to the fourth interdigital webspace of the left foot measuring 0.5 x 0.5 x 0.2 cm  To the above-noted ulceration, there is no eschar. There is a moderate amount of slough, fibrin and necrotic tissue. Granulation tissue and wound base is red. There is no malodor. There is a minimal amount of serosanginous drainage noted. Periwound integrity is intact.  Hyperkeratotic tissue noted to the 4th interdigital webspace of the left foot.  Skin is warm, dry and supple bilateral lower extremities. Negative for open lesions or macerations.  Vascular: Palpable pedal pulses bilaterally. No edema or erythema noted. Capillary refill within normal limits.  Neurological: Epicritic and protective threshold grossly intact bilaterally.   Musculoskeletal Exam: Range of motion within normal limits to all pedal and ankle joints bilateral. Muscle strength 5/5 in all groups bilateral.   Assessment: - heloma molle left foot  - ulceration to the fourth interdigital webspace left   Plan of Care:  - Patient evaluated.  - Excisional debridement of the open wound to the fourth interdigital webspace of the left foot was performed using a tissue nipper. Excisional debridement of all the necrotic nonviable tissue  down to healthy bleeding viable tissue was performed.   - Continue using Castellani paint daily.  - Patient wants surgery in the fall.  - Return to clinic in 3 months for surgical consult.    Edrick Kins, DPM [facility]  Dr. Edrick Kins, DPM    [address], [place] [address]                Office [phone]  Fax [phone]

## 2018-02-05 ENCOUNTER — Other Ambulatory Visit: Payer: Self-pay | Admitting: Family Medicine

## 2018-02-05 DIAGNOSIS — Z1231 Encounter for screening mammogram for malignant neoplasm of breast: Secondary | ICD-10-CM

## 2018-02-22 ENCOUNTER — Ambulatory Visit
Admission: RE | Admit: 2018-02-22 | Discharge: 2018-02-22 | Disposition: A | Discharge status: Home / Self Care | Payer: Medicare Other | Source: Ambulatory Visit | Attending: Family Medicine | Admitting: Family Medicine

## 2018-02-22 DIAGNOSIS — Z1231 Encounter for screening mammogram for malignant neoplasm of breast: Secondary | ICD-10-CM

## 2018-03-10 ENCOUNTER — Other Ambulatory Visit: Payer: Self-pay | Admitting: Physician Assistant

## 2018-04-30 ENCOUNTER — Ambulatory Visit: Payer: Medicare Other | Admitting: Podiatry

## 2018-04-30 ENCOUNTER — Encounter: Payer: Self-pay | Admitting: Podiatry

## 2018-04-30 DIAGNOSIS — L97529 Non-pressure chronic ulcer of other part of left foot with unspecified severity: Secondary | ICD-10-CM | POA: Diagnosis not present

## 2018-04-30 DIAGNOSIS — L84 Corns and callosities: Secondary | ICD-10-CM

## 2018-04-30 DIAGNOSIS — I83025 Varicose veins of left lower extremity with ulcer other part of foot: Secondary | ICD-10-CM | POA: Diagnosis not present

## 2018-04-30 DIAGNOSIS — L97522 Non-pressure chronic ulcer of other part of left foot with fat layer exposed: Secondary | ICD-10-CM

## 2018-04-30 DIAGNOSIS — S91109D Unspecified open wound of unspecified toe(s) without damage to nail, subsequent encounter: Secondary | ICD-10-CM

## 2018-04-30 NOTE — Patient Instructions (Signed)
Pre-Operative Instructions  Congratulations, you have decided to take an important step towards improving your quality of life.  You can be assured that the doctors and staff at [facility] will be with you every step of the way.  Here are some important things you should know:  1. Plan to be at the surgery center/hospital at least 1 (one) hour prior to your scheduled time, unless otherwise directed by the surgical center/hospital staff.  You must have a responsible adult accompany you, remain during the surgery and drive you home.  Make sure you have directions to the surgical center/hospital to ensure you arrive on time. 2. If you are having surgery at [facility] or [facility], you will need a copy of your medical history and physical form from your family physician within one month prior to the date of surgery. We will give you a form for your primary physician to complete.  3. We make every effort to accommodate the date you request for surgery.  However, there are times where surgery dates or times have to be moved.  We will contact you as soon as possible if a change in schedule is required.   4. No aspirin/ibuprofen for one week before surgery.  If you are on aspirin, any non-steroidal anti-inflammatory medications (Mobic, Aleve, Ibuprofen) should not be taken seven (7) days prior to your surgery.  You make take Tylenol for pain prior to surgery.  5. Medications - If you are taking daily heart and blood pressure medications, seizure, reflux, allergy, asthma, anxiety, pain or diabetes medications, make sure you notify the surgery center/hospital before the day of surgery so they can tell you which medications you should take or avoid the day of surgery. 6. No food or drink after midnight the night before surgery unless directed otherwise by surgical center/hospital staff. 7. No alcoholic beverages 24-hours prior to surgery.  No smoking 24-hours prior or 24-hours after  surgery. 8. Wear loose pants or shorts. They should be loose enough to fit over bandages, boots, and casts. 9. Don't wear slip-on shoes. Sneakers are preferred. 10. Bring your boot with you to the surgery center/hospital.  Also bring crutches or a walker if your physician has prescribed it for you.  If you do not have this equipment, it will be provided for you after surgery. 11. If you have not been contacted by the surgery center/hospital by the day before your surgery, call to confirm the date and time of your surgery. 12. Leave-time from work may vary depending on the type of surgery you have.  Appropriate arrangements should be made prior to surgery with your employer. 13. Prescriptions will be provided immediately following surgery by your doctor.  Fill these as soon as possible after surgery and take the medication as directed. Pain medications will not be refilled on weekends and must be approved by the doctor. 14. Remove nail polish on the operative foot and avoid getting pedicures prior to surgery. 15. Wash the night before surgery.  The night before surgery wash the foot and leg well with water and the antibacterial soap provided. Be sure to pay special attention to beneath the toenails and in between the toes.  Wash for at least three (3) minutes. Rinse thoroughly with water and dry well with a towel.  Perform this wash unless told not to do so by your physician.  Enclosed: 1 Ice pack (please put in freezer the night before surgery)   1 Hibiclens skin cleaner     Pre-op instructions  If you have any questions regarding the instructions, please do not hesitate to call our office.  [place]: [address], [place], [place] [address] -- [phone]  [place]: [address] [place], [place] [address] -- [phone]  [place]: [address]  , [place] [address] -- [phone]  [place]: [address], [address], [place], [place] [address] -- [phone]  Website:  [email or web address] 

## 2018-05-07 NOTE — Progress Notes (Signed)
   HPI: 68 year old female presenting today for follow up evaluation of a heloma molle of the left foot as well as a wound to the left foot. She states the wound is reappeared about three weeks ago and is worsened by wearing closed-toe shoes. She has been applying [employer] as directed and is now ready to have surgery. Patient is here for further evaluation and treatment.   Past Medical History:  Diagnosis Date  . Anxiety   . Constipation   . COPD (chronic obstructive pulmonary disease) (HCC)    mild  . Depression   . Diarrhea   . DJD (degenerative joint disease)   . Gastritis   . GERD (gastroesophageal reflux disease)   . Hyperlipemia   . Hypertension      Physical Exam: General: The patient is alert and oriented x3 in no acute distress.  Dermatology: Wound #1 noted to the fourth interdigital webspace of the left foot measuring 1.0 x 1.0 x 0.3 cm  To the above-noted ulceration, there is no eschar. There is a moderate amount of slough, fibrin and necrotic tissue. Granulation tissue and wound base is red. There is no malodor. There is a minimal amount of serosanginous drainage noted. Periwound integrity is intact.  Hyperkeratotic tissue noted to the 4th interdigital webspace of the left foot.  Skin is warm, dry and supple bilateral lower extremities. Negative for open lesions or macerations.  Vascular: Palpable pedal pulses bilaterally. No edema or erythema noted. Capillary refill within normal limits.  Neurological: Epicritic and protective threshold grossly intact bilaterally.   Musculoskeletal Exam: Range of motion within normal limits to all pedal and ankle joints bilateral. Muscle strength 5/5 in all groups bilateral.   Radiographic Exam:  Normal osseous mineralization. Joint spaces preserved. No fracture/dislocation/boney destruction.    Assessment: - heloma molle left foot  - ulceration to the fourth interdigital webspace left   Plan of Care:  1. Patient  evaluated. X-Rays reviewed.  2. Medically necessary excisional debridement including subcutaneous tissue was performed using a tissue nipper and a chisel blade. Excisional debridement of all the necrotic nonviable tissue down to healthy bleeding viable tissue was performed with post-debridement measurements same as pre-. 3. The wound was cleansed and dry sterile dressing applied. 4. Today we discussed the conservative versus surgical management of the presenting pathology. The patient opts for surgical management. All possible complications and details of the procedure were explained. All patient questions were answered. No guarantees were expressed or implied. 5. Authorization for surgery was initiated today. Surgery will consist of PIPJ arthroplasty left fifth digit.  6. Return to clinic one week post op.   Edrick Kins, DPM [facility]  Dr. Edrick Kins, DPM    [address], [place] [address]                Office [phone]  Fax [phone]

## 2018-05-08 ENCOUNTER — Telehealth: Payer: Self-pay | Admitting: *Deleted

## 2018-05-08 NOTE — Telephone Encounter (Signed)
"  I'm a patient of Daylene Katayama.  Could you give me a call about my surgery?  I need to talk to you about that.  I'm thinking about opting out of it at the moment and try a few other options first."  "Please give me a call."

## 2018-05-16 NOTE — Telephone Encounter (Signed)
"  I saw Dr. Amalia Hailey about two weeks ago about scheduling a surgery on an Ulcer on my toe.  I've had second thoughts about doing surgery.  I went to the [employer] and I bought bigger shoes.  I want to hold off on having surgery for not and see how this goes.  I do want to schedule an appointment with Dr. Amalia Hailey to let him check it to make sure it's okay."  I'll transfer you to a scheduler.    I transferred the call to [place].

## 2018-05-17 ENCOUNTER — Telehealth: Payer: Self-pay | Admitting: Podiatry

## 2018-05-17 NOTE — Telephone Encounter (Signed)
Left message to call with questions.

## 2018-05-17 NOTE — Telephone Encounter (Signed)
Pt called requesting that Dr.Evans nurse give her a call. Please give pt a call.

## 2018-05-17 NOTE — Telephone Encounter (Addendum)
Unable to contact pt phone line is busy, after 2 attempts.

## 2018-05-30 ENCOUNTER — Encounter: Payer: Self-pay | Admitting: Podiatry

## 2018-05-30 ENCOUNTER — Ambulatory Visit: Payer: Medicare Other | Admitting: Podiatry

## 2018-05-30 ENCOUNTER — Ambulatory Visit: Payer: Medicare Other

## 2018-05-30 DIAGNOSIS — L84 Corns and callosities: Secondary | ICD-10-CM | POA: Diagnosis not present

## 2018-05-30 DIAGNOSIS — L97522 Non-pressure chronic ulcer of other part of left foot with fat layer exposed: Secondary | ICD-10-CM

## 2018-05-30 DIAGNOSIS — Z9889 Other specified postprocedural states: Secondary | ICD-10-CM

## 2018-06-03 NOTE — Progress Notes (Signed)
   HPI: 68 year old female presenting today for follow up evaluation of a heloma molle of the left foot as well as a wound to the left foot. She states she is doing well. She denies any significant pain or modifying factors. She has been using antibiotic daily as directed on the wound. Patient is here for further evaluation and treatment.   Past Medical History:  Diagnosis Date  . Anxiety   . Constipation   . COPD (chronic obstructive pulmonary disease) (HCC)    mild  . Depression   . Diarrhea   . DJD (degenerative joint disease)   . Gastritis   . GERD (gastroesophageal reflux disease)   . Hyperlipemia   . Hypertension      Physical Exam: General: The patient is alert and oriented x3 in no acute distress.  Dermatology: Wound #1 noted to the fourth interdigital webspace of the left foot measuring 1.0 x 1.0 x 0.3 cm  To the above-noted ulceration, there is no eschar. There is a moderate amount of slough, fibrin and necrotic tissue. Granulation tissue and wound base is red. There is no malodor. There is a minimal amount of serosanginous drainage noted. Periwound integrity is intact.  Hyperkeratotic tissue noted to the 4th interdigital webspace of the left foot.  Skin is warm, dry and supple bilateral lower extremities. Negative for open lesions or macerations.  Vascular: Palpable pedal pulses bilaterally. No edema or erythema noted. Capillary refill within normal limits.  Neurological: Epicritic and protective threshold grossly intact bilaterally.   Musculoskeletal Exam: Range of motion within normal limits to all pedal and ankle joints bilateral. Muscle strength 5/5 in all groups bilateral.   Radiographic Exam:  Normal osseous mineralization. Joint spaces preserved. No fracture/dislocation/boney destruction.    Assessment: - heloma molle left foot  - ulceration to the fourth interdigital webspace left   Plan of Care:  1. Patient evaluated.   2. Medically necessary excisional  debridement including subcutaneous tissue was performed using a tissue nipper and a chisel blade. Excisional debridement of all the necrotic nonviable tissue down to healthy bleeding viable tissue was performed with post-debridement measurements same as pre-. 3. The wound was cleansed and dry sterile dressing applied. 4. Continue using antibiotic ointment daily.  5. Patient does not want surgery at the moment. Continue conservative treatment.  6. Recommended wide fitting shoes.  7. Return to clinic as needed.    Edrick Kins, DPM [facility]  Dr. Edrick Kins, DPM    [address], [place] [address]                Office [phone]  Fax [phone]

## 2018-06-27 ENCOUNTER — Other Ambulatory Visit: Payer: Medicare Other

## 2018-07-11 ENCOUNTER — Other Ambulatory Visit: Payer: Self-pay | Admitting: Physician Assistant

## 2018-10-26 ENCOUNTER — Other Ambulatory Visit: Payer: Self-pay | Admitting: Internal Medicine

## 2018-10-26 NOTE — Telephone Encounter (Signed)
[facility] patient - in my box my mistake

## 2018-10-26 NOTE — Telephone Encounter (Signed)
Patient needs OV for additional refills 

## 2019-05-11 ENCOUNTER — Ambulatory Visit: Payer: Medicare Other

## 2019-05-15 ENCOUNTER — Telehealth: Payer: Self-pay

## 2019-05-15 NOTE — Telephone Encounter (Signed)
Error-please disregard.   [place]

## 2019-05-22 ENCOUNTER — Ambulatory Visit: Payer: Medicare Other

## 2019-07-17 ENCOUNTER — Other Ambulatory Visit: Payer: Self-pay | Admitting: Family Medicine

## 2019-07-17 DIAGNOSIS — Z1231 Encounter for screening mammogram for malignant neoplasm of breast: Secondary | ICD-10-CM

## 2019-07-29 ENCOUNTER — Ambulatory Visit
Admission: RE | Admit: 2019-07-29 | Discharge: 2019-07-29 | Disposition: A | Discharge status: Home / Self Care | Payer: Medicare Other | Source: Ambulatory Visit | Attending: Family Medicine | Admitting: Family Medicine

## 2019-07-29 ENCOUNTER — Other Ambulatory Visit: Payer: Self-pay

## 2019-07-29 DIAGNOSIS — Z1231 Encounter for screening mammogram for malignant neoplasm of breast: Secondary | ICD-10-CM

## 2019-11-14 ENCOUNTER — Other Ambulatory Visit: Payer: Self-pay | Admitting: Family Medicine

## 2019-11-14 DIAGNOSIS — E2839 Other primary ovarian failure: Secondary | ICD-10-CM

## 2020-02-14 ENCOUNTER — Other Ambulatory Visit: Payer: Self-pay

## 2020-02-14 ENCOUNTER — Ambulatory Visit
Admission: RE | Admit: 2020-02-14 | Discharge: 2020-02-14 | Disposition: A | Discharge status: Home / Self Care | Payer: Self-pay | Source: Ambulatory Visit | Attending: Family Medicine | Admitting: Family Medicine

## 2020-02-14 DIAGNOSIS — E2839 Other primary ovarian failure: Secondary | ICD-10-CM

## 2020-02-14 DIAGNOSIS — M8589 Other specified disorders of bone density and structure, multiple sites: Secondary | ICD-10-CM | POA: Diagnosis not present

## 2020-02-14 DIAGNOSIS — Z780 Asymptomatic menopausal state: Secondary | ICD-10-CM | POA: Diagnosis not present

## 2020-06-30 ENCOUNTER — Other Ambulatory Visit: Payer: Self-pay | Admitting: Family Medicine

## 2020-06-30 DIAGNOSIS — Z1231 Encounter for screening mammogram for malignant neoplasm of breast: Secondary | ICD-10-CM

## 2020-08-21 ENCOUNTER — Ambulatory Visit: Payer: Medicare PPO

## 2020-10-07 DIAGNOSIS — Z1283 Encounter for screening for malignant neoplasm of skin: Secondary | ICD-10-CM | POA: Diagnosis not present

## 2020-10-07 DIAGNOSIS — D225 Melanocytic nevi of trunk: Secondary | ICD-10-CM | POA: Diagnosis not present

## 2020-10-16 ENCOUNTER — Other Ambulatory Visit: Payer: Self-pay

## 2020-10-16 ENCOUNTER — Ambulatory Visit
Admission: RE | Admit: 2020-10-16 | Discharge: 2020-10-16 | Disposition: A | Discharge status: Home / Self Care | Payer: Medicare PPO | Source: Ambulatory Visit | Attending: Family Medicine | Admitting: Family Medicine

## 2020-10-16 DIAGNOSIS — Z1231 Encounter for screening mammogram for malignant neoplasm of breast: Secondary | ICD-10-CM

## 2020-12-03 ENCOUNTER — Other Ambulatory Visit (HOSPITAL_BASED_OUTPATIENT_CLINIC_OR_DEPARTMENT_OTHER): Payer: Self-pay | Admitting: Family Medicine

## 2020-12-03 DIAGNOSIS — F17200 Nicotine dependence, unspecified, uncomplicated: Secondary | ICD-10-CM

## 2020-12-03 DIAGNOSIS — R0989 Other specified symptoms and signs involving the circulatory and respiratory systems: Secondary | ICD-10-CM | POA: Diagnosis not present

## 2020-12-03 DIAGNOSIS — E559 Vitamin D deficiency, unspecified: Secondary | ICD-10-CM | POA: Diagnosis not present

## 2020-12-03 DIAGNOSIS — Z0000 Encounter for general adult medical examination without abnormal findings: Secondary | ICD-10-CM | POA: Diagnosis not present

## 2020-12-03 DIAGNOSIS — E039 Hypothyroidism, unspecified: Secondary | ICD-10-CM | POA: Diagnosis not present

## 2020-12-03 DIAGNOSIS — Z79899 Other long term (current) drug therapy: Secondary | ICD-10-CM | POA: Diagnosis not present

## 2020-12-03 DIAGNOSIS — I10 Essential (primary) hypertension: Secondary | ICD-10-CM | POA: Diagnosis not present

## 2020-12-03 DIAGNOSIS — E785 Hyperlipidemia, unspecified: Secondary | ICD-10-CM | POA: Diagnosis not present

## 2020-12-03 DIAGNOSIS — R7303 Prediabetes: Secondary | ICD-10-CM | POA: Diagnosis not present

## 2020-12-07 DIAGNOSIS — H35033 Hypertensive retinopathy, bilateral: Secondary | ICD-10-CM | POA: Diagnosis not present

## 2021-02-11 DIAGNOSIS — U071 COVID-19: Secondary | ICD-10-CM | POA: Diagnosis not present

## 2021-03-09 DIAGNOSIS — R079 Chest pain, unspecified: Secondary | ICD-10-CM | POA: Diagnosis not present

## 2021-05-31 ENCOUNTER — Ambulatory Visit: Payer: Medicare PPO | Admitting: Podiatry

## 2021-05-31 ENCOUNTER — Other Ambulatory Visit: Payer: Self-pay

## 2021-05-31 ENCOUNTER — Ambulatory Visit (INDEPENDENT_AMBULATORY_CARE_PROVIDER_SITE_OTHER): Payer: Medicare PPO

## 2021-05-31 DIAGNOSIS — M778 Other enthesopathies, not elsewhere classified: Secondary | ICD-10-CM

## 2021-05-31 MED ORDER — METHYLPREDNISOLONE 4 MG PO TBPK: ORAL_TABLET | ORAL | 0 refills | Status: DC

## 2021-05-31 NOTE — Progress Notes (Signed)
° °  HPI: 71 y.o. female presenting today for new complaint regarding pain and tenderness associated to the bilateral midfoot.  Patient states that she has noticed an increase amount of pain and tenderness to the bilateral midfoot for several months now.  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  Past Medical History:  Diagnosis Date   Anxiety    Constipation    COPD (chronic obstructive pulmonary disease) (HCC)    mild   Depression    Diarrhea    DJD (degenerative joint disease)    Gastritis    GERD (gastroesophageal reflux disease)    Hyperlipemia    Hypertension     Past Surgical History:  Procedure Laterality Date   CARPAL TUNNEL RELEASE     Bilateral    TONSILLECTOMY     TUBAL LIGATION  1983    Allergies  Allergen Reactions   Amoxicillin    Iohexol     Jaw pain, nausea   Sulfa Drugs Cross Reactors     Per pt: unknown     Physical Exam: General: The patient is alert and oriented x3 in no acute distress.  Dermatology: Skin is warm, dry and supple bilateral lower extremities. Negative for open lesions or macerations.  Vascular: Palpable pedal pulses bilaterally. Capillary refill within normal limits.  Negative for any significant edema or erythema  Neurological: Light touch and protective threshold grossly intact  Musculoskeletal Exam: No pedal deformities noted.  There is some pain on palpation throughout the midfoot bilateral  Radiographic Exam:  Normal osseous mineralization.  There is no periarticular spurring throughout the midfoot however there does appear to be some slight degenerative changes noted throughout the midtarsal joint of the bilateral feet  Assessment: 1.  DJD/capsulitis bilateral midfoot   Plan of Care:  1. Patient evaluated. X-Rays reviewed.  2.  Patient declined injections today 3.  Prescription for Medrol Dosepak 4.  Patient is on long-term use of  meloxicam 7.5 mg 2 times daily as per her prescribing PCP.  Continue after completion of the Dosepak as per prescribing physician 5.  Recommend good supportive shoes that support the arch of the foot 6.  Return to clinic as needed  *Came in today with her granddaughter, Denny Peon (52yrs), freshman at [employer]      Edrick Kins, DPM [facility]  Dr. Edrick Kins, DPM    [address], [place] [address]                Office [phone]  Fax [phone]

## 2021-06-07 DIAGNOSIS — L03019 Cellulitis of unspecified finger: Secondary | ICD-10-CM | POA: Diagnosis not present

## 2021-06-07 DIAGNOSIS — L03011 Cellulitis of right finger: Secondary | ICD-10-CM | POA: Diagnosis not present

## 2021-06-07 DIAGNOSIS — M19041 Primary osteoarthritis, right hand: Secondary | ICD-10-CM | POA: Diagnosis not present

## 2021-06-07 DIAGNOSIS — M6530 Trigger finger, unspecified finger: Secondary | ICD-10-CM | POA: Diagnosis not present

## 2021-06-07 DIAGNOSIS — M19042 Primary osteoarthritis, left hand: Secondary | ICD-10-CM | POA: Diagnosis not present

## 2021-07-21 DIAGNOSIS — M542 Cervicalgia: Secondary | ICD-10-CM | POA: Diagnosis not present

## 2021-09-10 ENCOUNTER — Other Ambulatory Visit: Payer: Self-pay | Admitting: Family Medicine

## 2021-09-10 DIAGNOSIS — Z1231 Encounter for screening mammogram for malignant neoplasm of breast: Secondary | ICD-10-CM

## 2021-09-13 DIAGNOSIS — M19042 Primary osteoarthritis, left hand: Secondary | ICD-10-CM | POA: Diagnosis not present

## 2021-09-13 DIAGNOSIS — M1812 Unilateral primary osteoarthritis of first carpometacarpal joint, left hand: Secondary | ICD-10-CM | POA: Diagnosis not present

## 2021-09-13 DIAGNOSIS — M79645 Pain in left finger(s): Secondary | ICD-10-CM | POA: Diagnosis not present

## 2021-09-13 DIAGNOSIS — M19041 Primary osteoarthritis, right hand: Secondary | ICD-10-CM | POA: Diagnosis not present

## 2021-10-06 DIAGNOSIS — M1812 Unilateral primary osteoarthritis of first carpometacarpal joint, left hand: Secondary | ICD-10-CM | POA: Diagnosis not present

## 2021-10-06 DIAGNOSIS — M19041 Primary osteoarthritis, right hand: Secondary | ICD-10-CM | POA: Diagnosis not present

## 2021-10-06 DIAGNOSIS — M19042 Primary osteoarthritis, left hand: Secondary | ICD-10-CM | POA: Diagnosis not present

## 2021-10-18 ENCOUNTER — Ambulatory Visit
Admission: RE | Admit: 2021-10-18 | Discharge: 2021-10-18 | Disposition: A | Discharge status: Home / Self Care | Payer: Medicare PPO | Source: Ambulatory Visit | Attending: Family Medicine | Admitting: Family Medicine

## 2021-10-18 DIAGNOSIS — Z1231 Encounter for screening mammogram for malignant neoplasm of breast: Secondary | ICD-10-CM

## 2021-12-06 IMAGING — MG MM DIGITAL SCREENING BILAT W/ TOMO AND CAD
8 series · 8 of 24 positions shown · non-contrast
Comparison: Previous exam(s).

CLINICAL DATA: Screening.

EXAM:
DIGITAL SCREENING BILATERAL MAMMOGRAM WITH TOMOSYNTHESIS AND CAD
TECHNIQUE: Bilateral screening digital craniocaudal and mediolateral oblique
mammograms were obtained. Bilateral screening digital breast
tomosynthesis was performed. The images were evaluated with
computer-aided detection.

[R CC synth-2D]
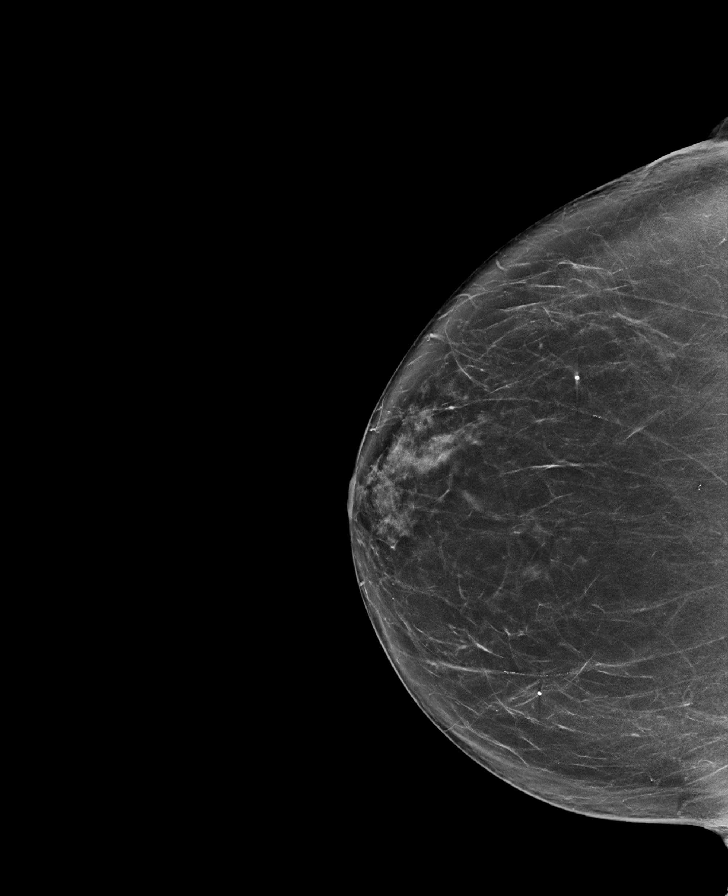

[L MLO synth-2D]
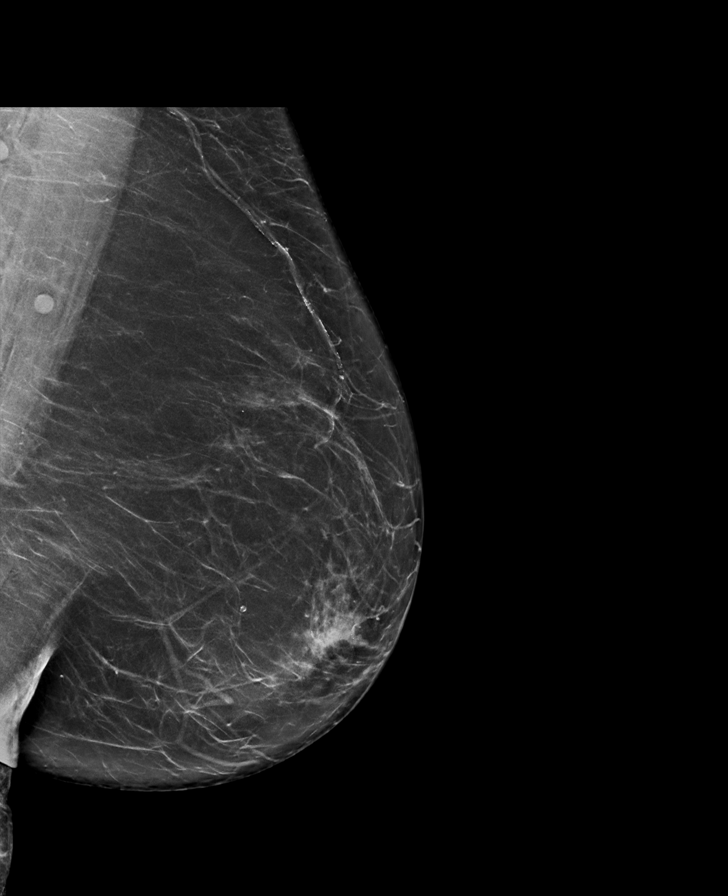

[R MLO synth-2D]
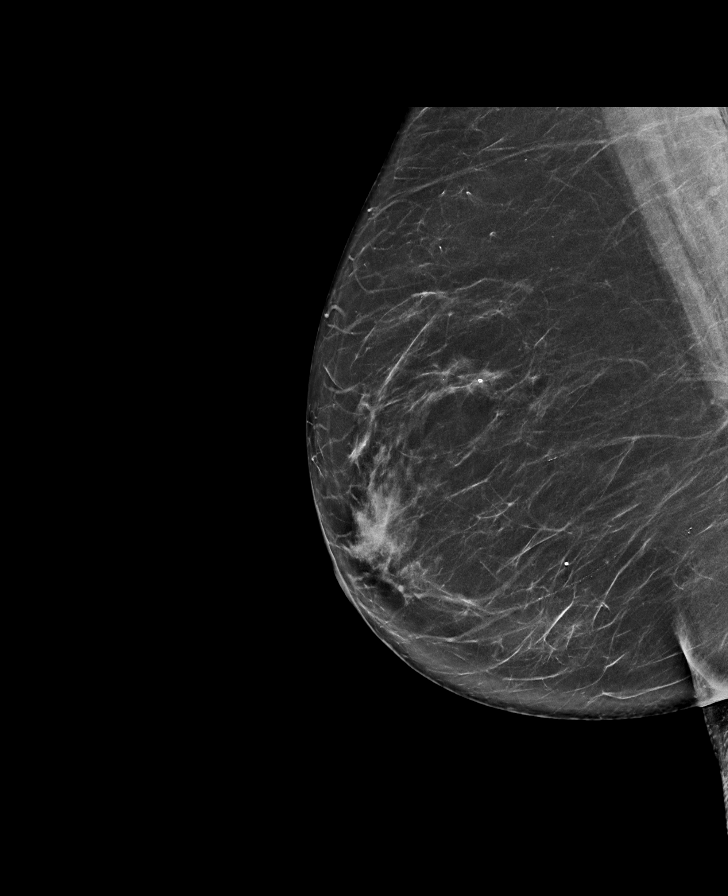

[L CC synth-2D]
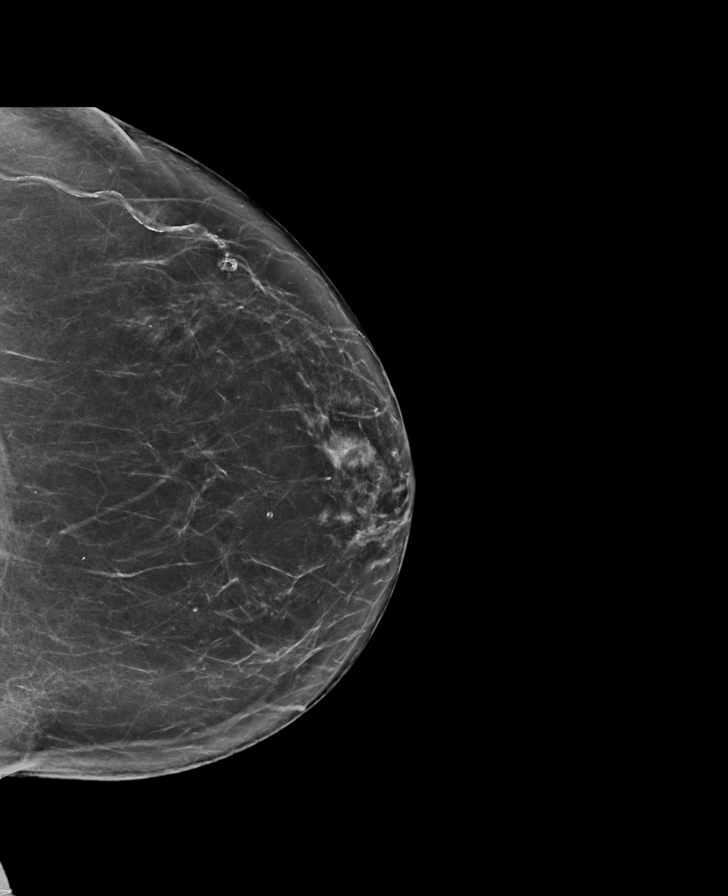

[R MLO tomo · tomo slice 39/78.0]
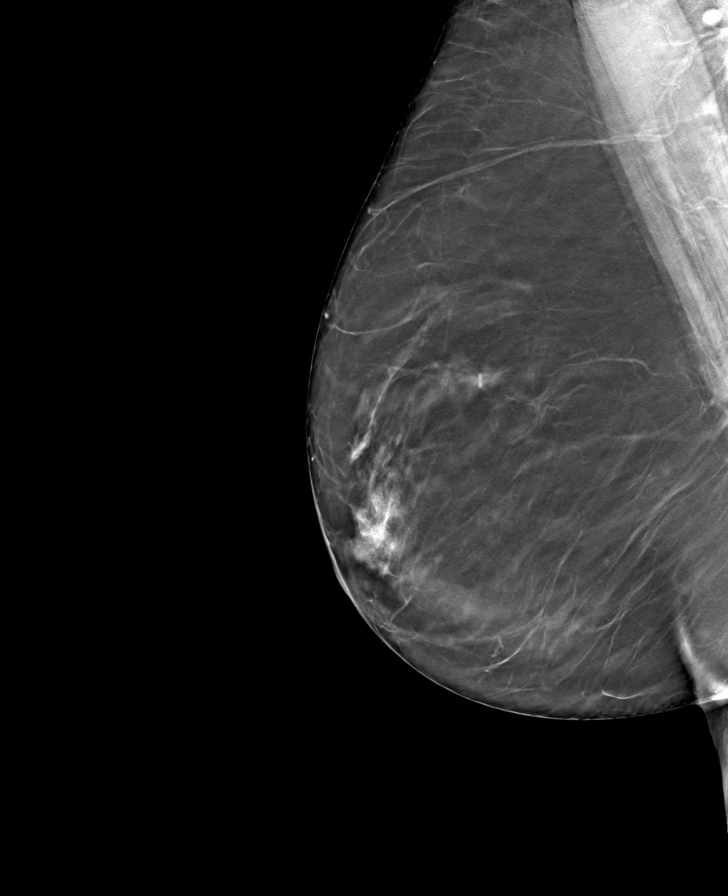

[R CC tomo · tomo slice 41/81.0]
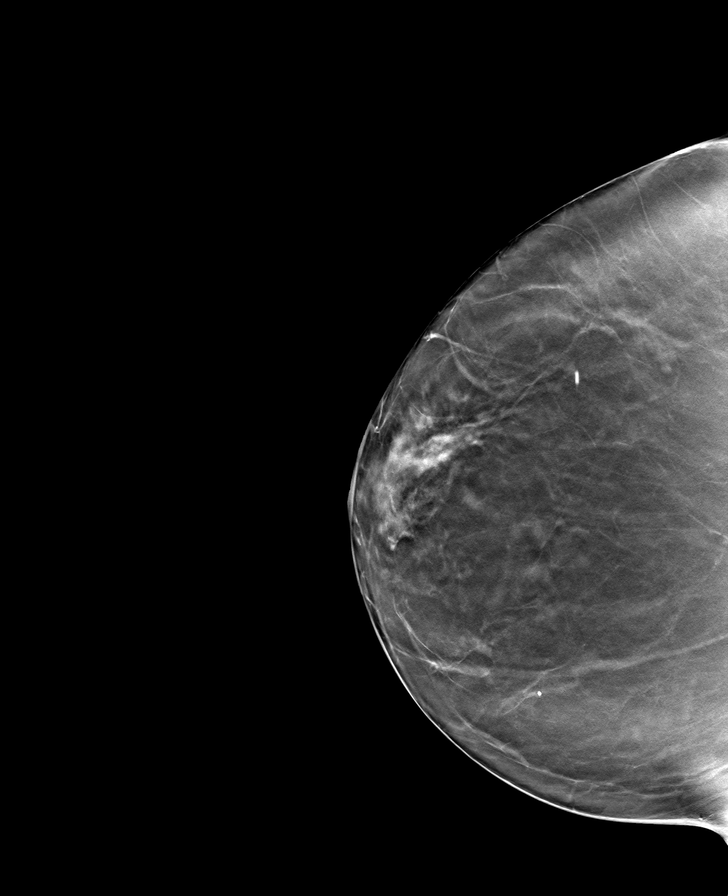

[L MLO tomo · tomo slice 40/79.0]
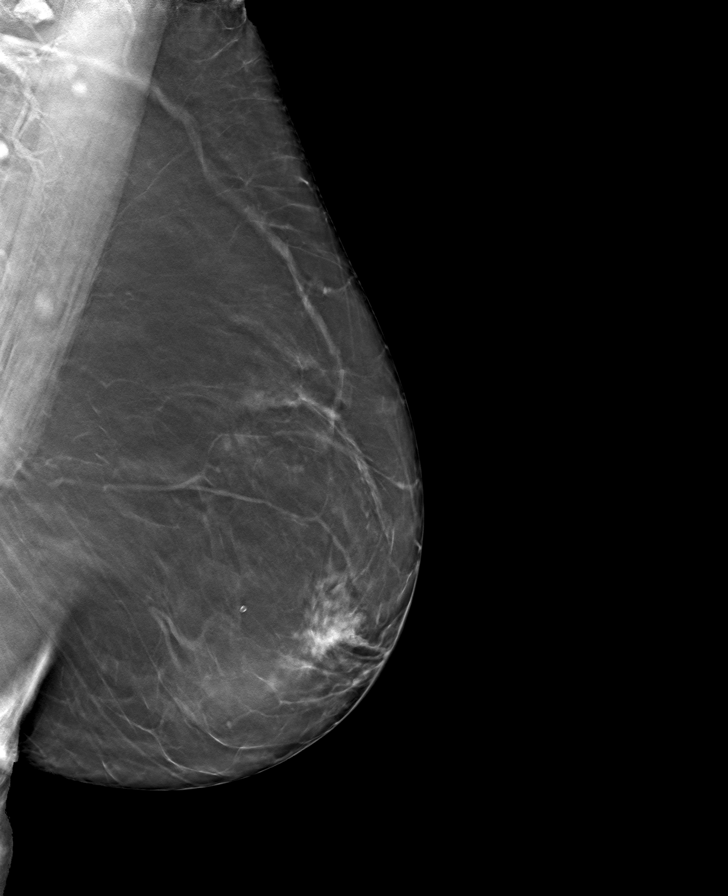

[L CC tomo · tomo slice 37/74.0]
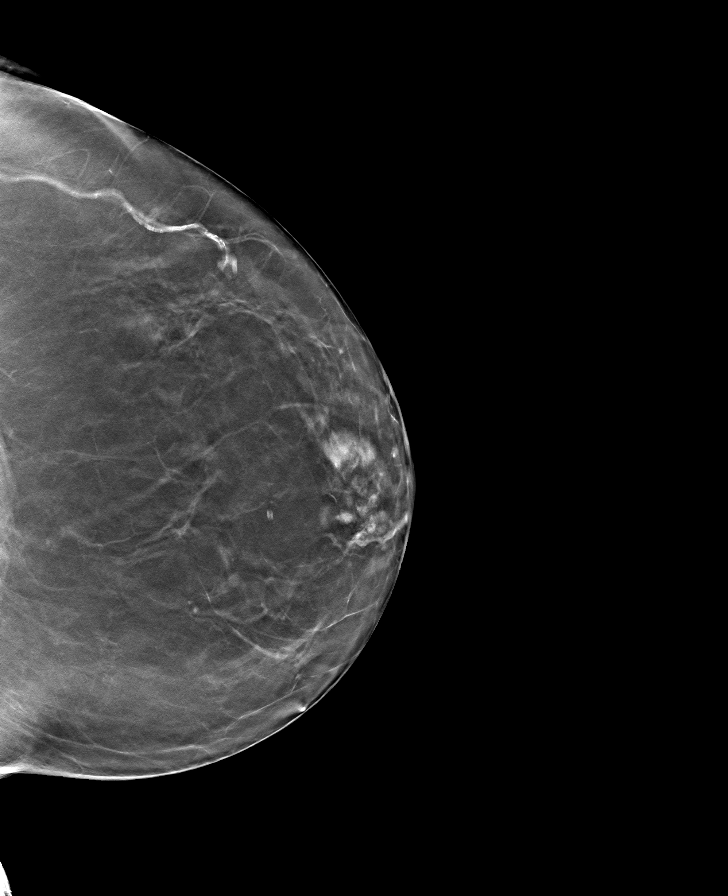

[8 of 24 positions shown; findings below may reference images not displayed]

ACR Breast Density Category b: There are scattered areas of
fibroglandular density.
FINDINGS: There are no findings suspicious for malignancy.
IMPRESSION: No mammographic evidence of malignancy. A result letter of this
screening mammogram will be mailed directly to the patient.

RECOMMENDATION:
Screening mammogram in one year. (Code:51-O-LD2)

BI-RADS CATEGORY  1: Negative.

## 2021-12-31 DIAGNOSIS — Z23 Encounter for immunization: Secondary | ICD-10-CM | POA: Diagnosis not present

## 2021-12-31 DIAGNOSIS — R7303 Prediabetes: Secondary | ICD-10-CM | POA: Diagnosis not present

## 2021-12-31 DIAGNOSIS — Z0000 Encounter for general adult medical examination without abnormal findings: Secondary | ICD-10-CM | POA: Diagnosis not present

## 2021-12-31 DIAGNOSIS — E785 Hyperlipidemia, unspecified: Secondary | ICD-10-CM | POA: Diagnosis not present

## 2021-12-31 DIAGNOSIS — E039 Hypothyroidism, unspecified: Secondary | ICD-10-CM | POA: Diagnosis not present

## 2021-12-31 DIAGNOSIS — D692 Other nonthrombocytopenic purpura: Secondary | ICD-10-CM | POA: Diagnosis not present

## 2021-12-31 DIAGNOSIS — Z79899 Other long term (current) drug therapy: Secondary | ICD-10-CM | POA: Diagnosis not present

## 2021-12-31 DIAGNOSIS — E559 Vitamin D deficiency, unspecified: Secondary | ICD-10-CM | POA: Diagnosis not present

## 2021-12-31 DIAGNOSIS — F17210 Nicotine dependence, cigarettes, uncomplicated: Secondary | ICD-10-CM | POA: Diagnosis not present

## 2022-01-07 DIAGNOSIS — M1711 Unilateral primary osteoarthritis, right knee: Secondary | ICD-10-CM | POA: Diagnosis not present

## 2022-01-21 DIAGNOSIS — M25561 Pain in right knee: Secondary | ICD-10-CM | POA: Diagnosis not present

## 2022-02-21 ENCOUNTER — Other Ambulatory Visit: Payer: Self-pay | Admitting: Orthopaedic Surgery

## 2022-02-28 DIAGNOSIS — M25661 Stiffness of right knee, not elsewhere classified: Secondary | ICD-10-CM | POA: Diagnosis not present

## 2022-02-28 DIAGNOSIS — M1731 Unilateral post-traumatic osteoarthritis, right knee: Secondary | ICD-10-CM | POA: Diagnosis not present

## 2022-02-28 DIAGNOSIS — R262 Difficulty in walking, not elsewhere classified: Secondary | ICD-10-CM | POA: Diagnosis not present

## 2022-03-01 NOTE — Patient Instructions (Signed)
DUE TO COVID-19 ONLY TWO VISITORS  (aged 73 and older)  ARE ALLOWED TO COME WITH YOU AND STAY IN THE WAITING ROOM ONLY DURING PRE OP AND PROCEDURE.   **NO VISITORS ARE ALLOWED IN THE SHORT STAY AREA OR RECOVERY ROOM!!**  IF YOU WILL BE ADMITTED INTO THE HOSPITAL YOU ARE ALLOWED ONLY FOUR SUPPORT PEOPLE DURING VISITATION HOURS ONLY (7 AM -8PM)   The support person(s) must pass our screening, gel in and out, and wear a mask at all times, including in the patient's room. Patients must also wear a mask when staff or their support person are in the room. Visitors GUEST BADGE MUST BE WORN VISIBLY  One adult visitor may remain with you overnight and MUST be in the room by 8 P.M.     Your procedure is scheduled on: 03/15/22   Report to [facility] Main Entrance    Report to admitting at  7:35 AM   Call this number if you have problems the morning of surgery [phone]   Do not eat food :After Midnight.   After Midnight you may have the following liquids until _6:50 _____ AM/  DAY OF SURGERY  Water Black Coffee (sugar ok, NO MILK/CREAM OR CREAMERS)  Tea (sugar ok, NO MILK/CREAM OR CREAMERS) regular and decaf                             Plain Jell-O (NO RED)                                           Fruit ices (not with fruit pulp, NO RED)                                     Popsicles (NO RED)                                                                  Juice: apple, WHITE grape, WHITE cranberry Sports drinks like Gatorade (NO RED)                   The day of surgery:  Drink ONE (1) Pre-Surgery Clear Ensure at 6:30 AM the morning of surgery. Drink in one sitting. Do not sip.  This drink was given to you during your hospital  pre-op appointment visit. Nothing else to drink after completing the  Pre-Surgery Clear Ensure at 6:50 AM          If you have questions, please contact your surgeon's office.     Oral Hygiene is also important to reduce your risk of infection.                                     Remember - BRUSH YOUR TEETH THE MORNING OF SURGERY WITH YOUR REGULAR TOOTHPASTE    Take these medicines the morning of surgery with A SIP OF WATER: Atorvastatin  Levothyroxine                                                                                                                            Omeprazole                                                                                                                             Pt has a nicotine patch                                You may not have any metal on your body including hair pins, jewelry, and body piercing             Do not wear make-up, lotions, powders, perfumes/cologne, or deodorant  Do not wear nail polish including gel and S&S, artificial/acrylic nails, or any other type of covering on natural nails including finger and toenails. If you have artificial nails, gel coating, etc. that needs to be removed by a nail salon please have this removed prior to surgery or surgery may need to be canceled/ delayed if the surgeon/ anesthesia feels like they are unable to be safely monitored.   Do not shave  48 hours prior to surgery.     Do not bring valuables to the hospital. [place].   Contacts, dentures or bridgework may not be worn into surgery.   DO NOT [place].    Patients discharged on the day of surgery will not be allowed to drive home.  Someone NEEDS to stay with you for the first 24 hours after anesthesia.   Special Instructions: Bring a copy of your healthcare power of attorney and living will documents  the day of surgery if you haven't scanned them before.              Please read over the following fact sheets you were given: IF YOU HAVE QUESTIONS ABOUT YOUR PRE-OP  INSTRUCTIONS PLEASE CALL [phone]    [facility] - Preparing for Surgery Before surgery, you can play an important role.  Because skin is not sterile, your skin needs to be as free of germs as possible.  You can reduce the number of germs on your skin by washing with  CHG (chlorahexidine gluconate) soap before surgery.  CHG is an antiseptic cleaner which kills germs and bonds with the skin to continue killing germs even after washing. Please DO NOT use if you have an allergy to CHG or antibacterial soaps.  If your skin becomes reddened/irritated stop using the CHG and inform your nurse when you arrive at Short Stay. Do not shave (including legs and underarms) for at least 48 hours prior to the first CHG shower.   Please follow these instructions carefully:  1.  Shower with CHG Soap the night before surgery and the  morning of Surgery.  2.  If you choose to wash your hair, wash your hair first as usual with your  normal  shampoo.  3.  After you shampoo, rinse your hair and body thoroughly to remove the  shampoo.                            4.  Use CHG as you would any other liquid soap.  You can apply chg directly  to the skin and wash                       Gently with a scrungie or clean washcloth.  5.  Apply the CHG Soap to your body ONLY FROM THE NECK DOWN.   Do not use on face/ open                           Wound or open sores. Avoid contact with eyes, ears mouth and genitals (private parts).                       Wash face,  Genitals (private parts) with your normal soap.             6.  Wash thoroughly, paying special attention to the area where your surgery  will be performed.  7.  Thoroughly rinse your body with warm water from the neck down.  8.  DO NOT shower/wash with your normal soap after using and rinsing off  the CHG Soap.                9.  Pat yourself dry with a clean towel.            10.  Wear clean pajamas.            11.  Place clean sheets on your bed the night of your  first shower and do not  sleep with pets. Day of Surgery : Do not apply any lotions/deodorants the morning of surgery.  Please wear clean clothes to the hospital/surgery center.  FAILURE TO FOLLOW THESE INSTRUCTIONS MAY RESULT IN THE CANCELLATION OF YOUR SURGERY   ________________________________________________________________________  Incentive Spirometer  An incentive spirometer is a tool that can help keep your lungs clear and active. This tool measures how well you are filling your lungs with each breath. Taking long deep breaths may help reverse or decrease the chance of developing breathing (pulmonary) problems (especially infection) following: A long period of time when you are unable to move or be active. BEFORE THE PROCEDURE  If the spirometer includes an indicator to show your best effort, your nurse or respiratory therapist will set it to a desired goal. If possible, sit up straight or lean slightly forward. Try not to slouch. Hold the incentive spirometer in  an upright position. INSTRUCTIONS FOR USE  Sit on the edge of your bed if possible, or sit up as far as you can in bed or on a chair. Hold the incentive spirometer in an upright position. Breathe out normally. Place the mouthpiece in your mouth and seal your lips tightly around it. Breathe in slowly and as deeply as possible, raising the piston or the ball toward the top of the column. Hold your breath for 3-5 seconds or for as long as possible. Allow the piston or ball to fall to the bottom of the column. Remove the mouthpiece from your mouth and breathe out normally. Rest for a few seconds and repeat Steps 1 through 7 at least 10 times every 1-2 hours when you are awake. Take your time and take a few normal breaths between deep breaths. The spirometer may include an indicator to show your best effort. Use the indicator as a goal to work toward during each repetition. After each set of 10 deep breaths, practice coughing  to be sure your lungs are clear. If you have an incision (the cut made at the time of surgery), support your incision when coughing by placing a pillow or rolled up towels firmly against it. Once you are able to get out of bed, walk around indoors and cough well. You may stop using the incentive spirometer when instructed by your caregiver.  RISKS AND COMPLICATIONS Take your time so you do not get dizzy or light-headed. If you are in pain, you may need to take or ask for pain medication before doing incentive spirometry. It is harder to take a deep breath if you are having pain. AFTER USE Rest and breathe slowly and easily. It can be helpful to keep track of a log of your progress. Your caregiver can provide you with a simple table to help with this. If you are using the spirometer at home, follow these instructions: [place] IF:  You are having difficultly using the spirometer. You have trouble using the spirometer as often as instructed. Your pain medication is not giving enough relief while using the spirometer. You develop fever of 100.5 F (38.1 C) or higher. SEEK IMMEDIATE MEDICAL CARE IF:  You cough up bloody sputum that had not been present before. You develop fever of 102 F (38.9 C) or greater. You develop worsening pain at or near the incision site. MAKE SURE YOU:  Understand these instructions. Will watch your condition. Will get help right away if you are not doing well or get worse. Document Released: 08/08/2006 Document Revised: 06/20/2011 Document Reviewed: 10/09/2006 [facility] Patient Information 2014 [place], [place].   ________________________________________________________________________

## 2022-03-02 ENCOUNTER — Other Ambulatory Visit: Payer: Self-pay

## 2022-03-02 ENCOUNTER — Encounter (HOSPITAL_COMMUNITY)
Admission: RE | Admit: 2022-03-02 | Discharge: 2022-03-02 | Disposition: A | Discharge status: Home / Self Care | Payer: Medicare PPO | Source: Ambulatory Visit | Attending: Orthopaedic Surgery | Admitting: Orthopaedic Surgery

## 2022-03-02 ENCOUNTER — Ambulatory Visit (HOSPITAL_COMMUNITY)
Admission: RE | Admit: 2022-03-02 | Discharge: 2022-03-02 | Disposition: A | Discharge status: Home / Self Care | Payer: Medicare PPO | Source: Ambulatory Visit | Attending: Orthopaedic Surgery | Admitting: Orthopaedic Surgery

## 2022-03-02 ENCOUNTER — Encounter (HOSPITAL_COMMUNITY): Payer: Self-pay

## 2022-03-02 DIAGNOSIS — Z01818 Encounter for other preprocedural examination: Secondary | ICD-10-CM | POA: Diagnosis not present

## 2022-03-02 DIAGNOSIS — I10 Essential (primary) hypertension: Secondary | ICD-10-CM | POA: Diagnosis not present

## 2022-03-02 HISTORY — DX: Diverticulosis of intestine, part unspecified, without perforation or abscess without bleeding: K57.90

## 2022-03-02 LAB — CBC
HCT: 40.8 % (ref 36.0–46.0)
Hemoglobin: 13 g/dL (ref 12.0–15.0)
MCH: 27.6 pg (ref 26.0–34.0)
MCHC: 31.9 g/dL (ref 30.0–36.0)
MCV: 86.6 fL (ref 80.0–100.0)
Platelets: 272 10*3/uL (ref 150–400)
RBC: 4.71 MIL/uL (ref 3.87–5.11)
RDW: 15.1 % (ref 11.5–15.5)
WBC: 7.7 10*3/uL (ref 4.0–10.5)
nRBC: 0 % (ref 0.0–0.2)

## 2022-03-02 LAB — BASIC METABOLIC PANEL
Anion gap: 8 (ref 5–15)
BUN: 22 mg/dL (ref 8–23)
CO2: 25 mmol/L (ref 22–32)
Calcium: 8.7 mg/dL — ABNORMAL LOW (ref 8.9–10.3)
Chloride: 107 mmol/L (ref 98–111)
Creatinine, Ser: 0.59 mg/dL (ref 0.44–1.00)
GFR, Estimated: >60 mL/min (ref >60)
Glucose, Bld: 106 mg/dL — ABNORMAL HIGH (ref 70–99)
Potassium: 4.4 mmol/L (ref 3.5–5.1)
Sodium: 140 mmol/L (ref 135–145)

## 2022-03-02 LAB — SURGICAL PCR SCREEN
MRSA, PCR: NEGATIVE
Staphylococcus aureus: NEGATIVE

## 2022-03-02 NOTE — Progress Notes (Signed)
Anesthesia note:  Bowel prep reminder:  NA  PCP - Dr. Solon Palm Cardiologist -none Other-   Chest x-ray - 03/02/22 EKG - 02/22/22-epic Stress Test - no ECHO - no Cardiac Cath - no CABG-no Pacemaker/ICD device last checked:NA  Sleep Study - no CPAP - no  Pt is pre diabetic-no CBG at PAT visit- Fasting Blood Sugar at home- Checks Blood Sugar _____  Blood Thinner:ASA 81 mg Blood Thinner Instructions: Aspirin Instructions:none Last Dose:  Anesthesia review: /No    Patient denies shortness of breath, fever, cough and chest pain at PAT appointment. Pt just quit smoking 10/29th. She finished with her nicotine patch. She has no SOB with activities   Patient verbalized understanding of instructions that were given to them at the PAT appointment. Patient was also instructed that they will need to review over the PAT instructions again at home before surgery.yes

## 2022-03-08 NOTE — Care Plan (Signed)
Ortho Bundle Case Management Note  Patient Details  Name: Breanna Wolfe MRN: [id number] Date of Birth: 05-17-1950    spoke with patient. she will discharge to home with family to assist. rolling walker orderd. OPPT set up with [facility]. discharge instructions discussed and mailed to patient. she voiced receiving them. questions answered. Choice offered. Patient and MD in agreement with plan            DME Arranged:    DME Agency:     HH Arranged:    [place] Agency:     Additional Comments: Please contact me with any questions of if this plan should need to change.  Ladell Heads,  [place] Specialist  [phone] 03/08/2022, 10:52 AM

## 2022-03-14 NOTE — H&P (Signed)
TOTAL KNEE ADMISSION H&P  Patient is being admitted for right total knee arthroplasty.  Subjective:  Chief Complaint:right knee pain.  HPI: Breanna Wolfe, 71 y.o. female, has a history of pain and functional disability in the right knee due to arthritis and has failed non-surgical conservative treatments for greater than 12 weeks to includeNSAID's and/or analgesics, corticosteriod injections, flexibility and strengthening excercises, supervised PT with diminished ADL's post treatment, use of assistive devices, weight reduction as appropriate, and activity modification.  Onset of symptoms was gradual, starting 5 years ago with gradually worsening course since that time. The patient noted no past surgery on the right knee(s).  Patient currently rates pain in the right knee(s) at 10 out of 10 with activity. Patient has night pain, worsening of pain with activity and weight bearing, pain that interferes with activities of daily living, and crepitus.  Patient has evidence of subchondral cysts, subchondral sclerosis, periarticular osteophytes, and joint space narrowing by imaging studies.  There is no active infection.  Patient Active Problem List   Diagnosis Date Noted   Atrophic vaginitis 05/22/2017   Menopausal symptom 05/22/2017   HTN (hypertension) 12/19/2016   Hypothyroidism 12/19/2016   Hx of adenomatous colonic polyps 12/19/2016   Family hx of colon cancer 12/19/2016   Past Medical History:  Diagnosis Date   Anxiety    COPD (chronic obstructive pulmonary disease) (HCC)    mild   Depression    Diverticulosis    DJD (degenerative joint disease)    GERD (gastroesophageal reflux disease)    Hyperlipemia    Hypertension     Past Surgical History:  Procedure Laterality Date   CARPAL TUNNEL RELEASE  2013   Bilateral    TONSILLECTOMY     as a child   TUBAL LIGATION  04/11/1981    No current facility-administered medications for this encounter.   Current Outpatient  Medications  Medication Sig Dispense Refill Last Dose   acetaminophen (TYLENOL) 500 MG tablet Take 500-1,000 mg by mouth every 6 (six) hours as needed (pain).      amLODipine (NORVASC) 5 MG tablet Take 5 mg by mouth at bedtime.      aspirin EC 81 MG tablet Take 81 mg by mouth at bedtime.      atorvastatin (LIPITOR) 10 MG tablet Take 10 mg by mouth at bedtime.      celecoxib (CELEBREX) 200 MG capsule Take 200 mg by mouth at bedtime.      levothyroxine (SYNTHROID, LEVOTHROID) 100 MCG tablet Take 100 mcg by mouth daily before breakfast.  5    metoprolol succinate (TOPROL-XL) 100 MG 24 hr tablet Take 100 mg by mouth at bedtime.      naproxen sodium (ALEVE) 220 MG tablet Take 220-440 mg by mouth 2 (two) times daily as needed (pain.).      nicotine (NICODERM CQ - DOSED IN MG/24 HOURS) 21 mg/24hr patch Place 21 mg onto the skin every evening.      omeprazole (PRILOSEC) 40 MG capsule Take 40 mg by mouth 2 (two) times daily as needed (indigestion/heartburn.). One tablet by mouth once daily      Polyvinyl Alcohol-Povidone (CLEAR EYES NATURAL TEARS) 5-6 MG/ML SOLN Place 1-2 drops into both eyes 3 (three) times daily as needed (dry/irritated eyes).      sertraline (ZOLOFT) 100 MG tablet Take 100 mg by mouth at bedtime. As needed      Allergies  Allergen Reactions   Amoxicillin    Iohexol Nausea Only  Jaw pain, nausea   Sulfa Drugs Cross Reactors     Per pt: unknown    Social History   Tobacco Use   Smoking status: Former    Packs/day: 1.00    Years: 40.00    Total pack years: 40.00    Types: Cigarettes    Quit date: 02/01/2009    Years since quitting: 13.1   Smokeless tobacco: Never  Substance Use Topics   Alcohol use: Yes    Comment: occasional    Family History  Problem Relation Age of Onset   Rectal cancer Mother    Colon cancer Mother 81   Breast cancer Maternal Aunt      Review of Systems  Musculoskeletal:  Positive for arthralgias.       Right knee   All other systems  reviewed and are negative.   Objective:  Physical Exam Constitutional:      Appearance: Normal appearance.  HENT:     Head: Normocephalic and atraumatic.     Mouth/Throat:     Pharynx: Oropharynx is clear.  Eyes:     Extraocular Movements: Extraocular movements intact.  Pulmonary:     Effort: Pulmonary effort is normal.  Abdominal:     Palpations: Abdomen is soft.  Musculoskeletal:     Cervical back: Normal range of motion.     Comments: Right knee exam is the same with trace effusion and motion from 0-130.  She has medial joint line pain and crepitation.  Ligaments are stable.  Hip motion is full and straight leg raise is negative.    Skin:    General: Skin is warm and dry.  Neurological:     General: No focal deficit present.     Mental Status: She is alert and oriented to person, place, and time. Mental status is at baseline.  Psychiatric:        Mood and Affect: Mood normal.        Behavior: Behavior normal.        Thought Content: Thought content normal.        Judgment: Judgment normal.     Vital signs in last 24 hours:    Labs:   Estimated body mass index is 24.4 kg/m as calculated from the following:   Height as of 03/02/22: 5' 5.75" (1.67 m).   Weight as of 03/02/22: 68 kg.   Imaging Review Plain radiographs demonstrate severe degenerative joint disease of the right knee(s). The overall alignment isneutral. The bone quality appears to be good for age and reported activity level.      Assessment/Plan:  End stage primary arthritis, right knee   The patient history, physical examination, clinical judgment of the provider and imaging studies are consistent with end stage degenerative joint disease of the right knee(s) and total knee arthroplasty is deemed medically necessary. The treatment options including medical management, injection therapy arthroscopy and arthroplasty were discussed at length. The risks and benefits of total knee arthroplasty were  presented and reviewed. The risks due to aseptic loosening, infection, stiffness, patella tracking problems, thromboembolic complications and other imponderables were discussed. The patient acknowledged the explanation, agreed to proceed with the plan and consent was signed. Patient is being admitted for inpatient treatment for surgery, pain control, PT, OT, prophylactic antibiotics, VTE prophylaxis, progressive ambulation and ADL's and discharge planning. The patient is planning to be discharged home with home health services     Patient's anticipated LOS is less than 2 midnights, meeting these requirements: - Younger  than 62 - Lives within 1 hour of care - Has a competent adult at home to recover with post-op recover - NO history of  - Chronic pain requiring opiods  - Diabetes  - Coronary Artery Disease  - Heart failure  - Heart attack  - Stroke  - DVT/VTE  - Cardiac arrhythmia  - Respiratory Failure/COPD  - Renal failure  - Anemia  - Advanced Liver disease

## 2022-03-15 ENCOUNTER — Encounter (HOSPITAL_COMMUNITY)
Admission: RE | Disposition: A | Discharge status: Home / Self Care | Payer: Self-pay | Source: Ambulatory Visit | Attending: Orthopaedic Surgery

## 2022-03-15 ENCOUNTER — Ambulatory Visit (HOSPITAL_COMMUNITY): Payer: Medicare PPO | Admitting: Certified Registered Nurse Anesthetist

## 2022-03-15 ENCOUNTER — Other Ambulatory Visit: Payer: Self-pay

## 2022-03-15 ENCOUNTER — Encounter (HOSPITAL_COMMUNITY): Payer: Self-pay | Admitting: Orthopaedic Surgery

## 2022-03-15 ENCOUNTER — Ambulatory Visit (HOSPITAL_COMMUNITY)
Admission: RE | Admit: 2022-03-15 | Discharge: 2022-03-15 | Disposition: A | Discharge status: Home / Self Care | Payer: Medicare PPO | Source: Ambulatory Visit | Attending: Orthopaedic Surgery | Admitting: Orthopaedic Surgery

## 2022-03-15 ENCOUNTER — Ambulatory Visit (HOSPITAL_BASED_OUTPATIENT_CLINIC_OR_DEPARTMENT_OTHER): Payer: Medicare PPO | Admitting: Certified Registered Nurse Anesthetist

## 2022-03-15 DIAGNOSIS — Z01818 Encounter for other preprocedural examination: Secondary | ICD-10-CM

## 2022-03-15 DIAGNOSIS — I10 Essential (primary) hypertension: Secondary | ICD-10-CM

## 2022-03-15 DIAGNOSIS — Z96651 Presence of right artificial knee joint: Secondary | ICD-10-CM | POA: Diagnosis not present

## 2022-03-15 DIAGNOSIS — M1711 Unilateral primary osteoarthritis, right knee: Secondary | ICD-10-CM | POA: Insufficient documentation

## 2022-03-15 DIAGNOSIS — G8918 Other acute postprocedural pain: Secondary | ICD-10-CM | POA: Diagnosis not present

## 2022-03-15 DIAGNOSIS — K219 Gastro-esophageal reflux disease without esophagitis: Secondary | ICD-10-CM | POA: Insufficient documentation

## 2022-03-15 DIAGNOSIS — Z87891 Personal history of nicotine dependence: Secondary | ICD-10-CM

## 2022-03-15 DIAGNOSIS — J449 Chronic obstructive pulmonary disease, unspecified: Secondary | ICD-10-CM

## 2022-03-15 DIAGNOSIS — E039 Hypothyroidism, unspecified: Secondary | ICD-10-CM | POA: Insufficient documentation

## 2022-03-15 HISTORY — PX: TOTAL KNEE ARTHROPLASTY: SHX125

## 2022-03-15 SURGERY — ARTHROPLASTY, KNEE, TOTAL
Anesthesia: Spinal | Site: Knee | Laterality: Right

## 2022-03-15 MED ORDER — SODIUM CHLORIDE (PF) 0.9 % IJ SOLN
INTRAMUSCULAR | Status: DC | PRN
  Administered 2022-03-15: 30 mL

## 2022-03-15 MED ORDER — OXYCODONE HCL 5 MG/5ML PO SOLN: 5.0000 mg | Freq: Once | ORAL | Status: AC | PRN

## 2022-03-15 MED ORDER — VANCOMYCIN HCL IN DEXTROSE 1-5 GM/200ML-% IV SOLN: 1000.0000 mg | INTRAVENOUS | Status: DC

## 2022-03-15 MED ORDER — OXYCODONE HCL 5 MG PO TABS
ORAL_TABLET | ORAL | Status: AC
  Administered 2022-03-15: 5 mg
  Filled 2022-03-15: qty 1

## 2022-03-15 MED ORDER — BUPIVACAINE LIPOSOME 1.3 % IJ SUSP: 20.0000 mL | Freq: Once | INTRAMUSCULAR | Status: DC

## 2022-03-15 MED ORDER — PHENYLEPHRINE HCL-NACL 20-0.9 MG/250ML-% IV SOLN
INTRAVENOUS | Status: DC | PRN
  Administered 2022-03-15: 35 ug/min via INTRAVENOUS

## 2022-03-15 MED ORDER — POVIDONE-IODINE 10 % EX SWAB
2.0000 | Freq: Once | CUTANEOUS | Status: AC
  Administered 2022-03-15: 2 via TOPICAL

## 2022-03-15 MED ORDER — PROPOFOL 500 MG/50ML IV EMUL
INTRAVENOUS | Status: DC | PRN
  Administered 2022-03-15: 10 mg via INTRAVENOUS
  Administered 2022-03-15: 50 ug/kg/min via INTRAVENOUS

## 2022-03-15 MED ORDER — HYDROMORPHONE HCL 1 MG/ML IJ SOLN: 0.2500 mg | INTRAMUSCULAR | Status: DC | PRN

## 2022-03-15 MED ORDER — BUPIVACAINE LIPOSOME 1.3 % IJ SUSP
INTRAMUSCULAR | Status: DC | PRN
  Administered 2022-03-15: 20 mL

## 2022-03-15 MED ORDER — FENTANYL CITRATE PF 50 MCG/ML IJ SOSY
50.0000 ug | PREFILLED_SYRINGE | INTRAMUSCULAR | Status: DC
  Administered 2022-03-15: 100 ug via INTRAVENOUS
  Filled 2022-03-15: qty 2

## 2022-03-15 MED ORDER — TRANEXAMIC ACID-NACL 1000-0.7 MG/100ML-% IV SOLN
INTRAVENOUS | Status: AC
  Filled 2022-03-15: qty 100

## 2022-03-15 MED ORDER — PROPOFOL 1000 MG/100ML IV EMUL
INTRAVENOUS | Status: AC
  Filled 2022-03-15: qty 100

## 2022-03-15 MED ORDER — TRANEXAMIC ACID-NACL 1000-0.7 MG/100ML-% IV SOLN
1000.0000 mg | INTRAVENOUS | Status: AC
  Administered 2022-03-15: 1000 mg via INTRAVENOUS
  Filled 2022-03-15: qty 100

## 2022-03-15 MED ORDER — ONDANSETRON HCL 4 MG/2ML IJ SOLN
INTRAMUSCULAR | Status: AC
  Filled 2022-03-15: qty 2

## 2022-03-15 MED ORDER — ORAL CARE MOUTH RINSE: 15.0000 mL | Freq: Once | OROMUCOSAL | Status: AC

## 2022-03-15 MED ORDER — TRANEXAMIC ACID-NACL 1000-0.7 MG/100ML-% IV SOLN
1000.0000 mg | Freq: Once | INTRAVENOUS | Status: AC
  Administered 2022-03-15: 1000 mg via INTRAVENOUS

## 2022-03-15 MED ORDER — LACTATED RINGERS IV BOLUS: 250.0000 mL | Freq: Once | INTRAVENOUS | Status: DC

## 2022-03-15 MED ORDER — TRANEXAMIC ACID 1000 MG/10ML IV SOLN
2000.0000 mg | INTRAVENOUS | Status: DC
  Filled 2022-03-15: qty 20

## 2022-03-15 MED ORDER — OXYCODONE HCL 5 MG PO TABS
5.0000 mg | ORAL_TABLET | Freq: Once | ORAL | Status: AC | PRN
  Administered 2022-03-15: 5 mg via ORAL

## 2022-03-15 MED ORDER — BUPIVACAINE-EPINEPHRINE 0.5% -1:200000 IJ SOLN
INTRAMUSCULAR | Status: DC | PRN
  Administered 2022-03-15: 30 mL

## 2022-03-15 MED ORDER — SODIUM CHLORIDE (PF) 0.9 % IJ SOLN
INTRAMUSCULAR | Status: AC
  Filled 2022-03-15: qty 30

## 2022-03-15 MED ORDER — BUPIVACAINE IN DEXTROSE 0.75-8.25 % IT SOLN
INTRATHECAL | Status: DC | PRN
  Administered 2022-03-15: 1.4 mL via INTRATHECAL

## 2022-03-15 MED ORDER — CEFAZOLIN SODIUM-DEXTROSE 2-4 GM/100ML-% IV SOLN
2.0000 g | Freq: Once | INTRAVENOUS | Status: AC
  Administered 2022-03-15: 2 g via INTRAVENOUS
  Filled 2022-03-15: qty 100

## 2022-03-15 MED ORDER — LACTATED RINGERS IV SOLN: INTRAVENOUS | Status: DC

## 2022-03-15 MED ORDER — METHOCARBAMOL 500 MG IVPB - SIMPLE MED: 500.0000 mg | Freq: Four times a day (QID) | INTRAVENOUS | Status: DC | PRN

## 2022-03-15 MED ORDER — METHOCARBAMOL 500 MG PO TABS
500.0000 mg | ORAL_TABLET | Freq: Four times a day (QID) | ORAL | Status: DC | PRN
  Administered 2022-03-15: 500 mg via ORAL

## 2022-03-15 MED ORDER — ASPIRIN 81 MG PO TBEC: 81.0000 mg | DELAYED_RELEASE_TABLET | Freq: Two times a day (BID) | ORAL | 0 refills | Status: AC

## 2022-03-15 MED ORDER — 0.9 % SODIUM CHLORIDE (POUR BTL) OPTIME
TOPICAL | Status: DC | PRN
  Administered 2022-03-15: 1000 mL

## 2022-03-15 MED ORDER — TIZANIDINE HCL 2 MG PO TABS: 2.0000 mg | ORAL_TABLET | Freq: Four times a day (QID) | ORAL | 1 refills | Status: DC | PRN

## 2022-03-15 MED ORDER — BUPIVACAINE LIPOSOME 1.3 % IJ SUSP
INTRAMUSCULAR | Status: AC
  Filled 2022-03-15: qty 20

## 2022-03-15 MED ORDER — METHOCARBAMOL 500 MG PO TABS
ORAL_TABLET | ORAL | Status: AC
  Filled 2022-03-15: qty 1

## 2022-03-15 MED ORDER — SODIUM CHLORIDE 0.9 % IR SOLN
Status: DC | PRN
  Administered 2022-03-15: 3000 mL

## 2022-03-15 MED ORDER — CEFAZOLIN SODIUM-DEXTROSE 2-4 GM/100ML-% IV SOLN
INTRAVENOUS | Status: AC
  Filled 2022-03-15: qty 100

## 2022-03-15 MED ORDER — MIDAZOLAM HCL 2 MG/2ML IJ SOLN
1.0000 mg | INTRAMUSCULAR | Status: DC
  Administered 2022-03-15: 2 mg via INTRAVENOUS
  Filled 2022-03-15: qty 2

## 2022-03-15 MED ORDER — OXYCODONE HCL 5 MG PO TABS
ORAL_TABLET | ORAL | Status: AC
  Filled 2022-03-15: qty 1

## 2022-03-15 MED ORDER — ROPIVACAINE HCL 7.5 MG/ML IJ SOLN
INTRAMUSCULAR | Status: DC | PRN
  Administered 2022-03-15: 20 mL via PERINEURAL

## 2022-03-15 MED ORDER — HYDROCODONE-ACETAMINOPHEN 5-325 MG PO TABS: 1.0000 | ORAL_TABLET | Freq: Four times a day (QID) | ORAL | 0 refills | Status: DC | PRN

## 2022-03-15 MED ORDER — LACTATED RINGERS IV BOLUS
500.0000 mL | Freq: Once | INTRAVENOUS | Status: AC
  Administered 2022-03-15: 500 mL via INTRAVENOUS

## 2022-03-15 MED ORDER — CEFAZOLIN SODIUM-DEXTROSE 2-4 GM/100ML-% IV SOLN
2.0000 g | Freq: Four times a day (QID) | INTRAVENOUS | Status: DC
  Administered 2022-03-15: 2 g via INTRAVENOUS

## 2022-03-15 MED ORDER — TRANEXAMIC ACID 1000 MG/10ML IV SOLN
INTRAVENOUS | Status: DC | PRN
  Administered 2022-03-15: 2000 mg via TOPICAL

## 2022-03-15 MED ORDER — ACETAMINOPHEN 10 MG/ML IV SOLN: 1000.0000 mg | Freq: Once | INTRAVENOUS | Status: DC | PRN

## 2022-03-15 MED ORDER — BUPIVACAINE-EPINEPHRINE (PF) 0.5% -1:200000 IJ SOLN
INTRAMUSCULAR | Status: AC
  Filled 2022-03-15: qty 30

## 2022-03-15 MED ORDER — ONDANSETRON HCL 4 MG/2ML IJ SOLN
4.0000 mg | Freq: Once | INTRAMUSCULAR | Status: AC | PRN
  Administered 2022-03-15: 4 mg via INTRAVENOUS

## 2022-03-15 MED ORDER — ONDANSETRON HCL 4 MG/2ML IJ SOLN
INTRAMUSCULAR | Status: DC | PRN
  Administered 2022-03-15: 4 mg via INTRAVENOUS

## 2022-03-15 MED ORDER — CHLORHEXIDINE GLUCONATE 0.12 % MT SOLN
15.0000 mL | Freq: Once | OROMUCOSAL | Status: AC
  Administered 2022-03-15: 15 mL via OROMUCOSAL

## 2022-03-15 SURGICAL SUPPLY — 57 items
ATTUNE MED DOME PAT 38 KNEE (Knees) IMPLANT
ATTUNE PS FEM RT SZ 5 CEM KNEE (Femur) IMPLANT
ATTUNE PSRP INSR SZ5 6 KNEE (Insert) IMPLANT
BAG COUNTER SPONGE SURGICOUNT (BAG) ×2 IMPLANT
BAG DECANTER FOR FLEXI CONT (MISCELLANEOUS) ×2 IMPLANT
BAG SPEC THK2 15X12 ZIP CLS (MISCELLANEOUS) ×1
BAG SPNG CNTER NS LX DISP (BAG) ×1
BAG ZIPLOCK 12X15 (MISCELLANEOUS) ×2 IMPLANT
BASE TIBIAL ROT PLAT SZ 5 KNEE (Knees) IMPLANT
BLADE SAGITTAL 25.0X1.19X90 (BLADE) ×2 IMPLANT
BLADE SAW SGTL 11.0X1.19X90.0M (BLADE) ×2 IMPLANT
BLADE SURG SZ10 CARB STEEL (BLADE) ×2 IMPLANT
BNDG CMPR MED 10X6 ELC LF (GAUZE/BANDAGES/DRESSINGS) ×1
BNDG ELASTIC 6X10 VLCR STRL LF (GAUZE/BANDAGES/DRESSINGS) IMPLANT
BNDG ELASTIC 6X5.8 VLCR STR LF (GAUZE/BANDAGES/DRESSINGS) ×2 IMPLANT
BOOTIES KNEE HIGH SLOAN (MISCELLANEOUS) ×2 IMPLANT
BOWL SMART MIX CTS (DISPOSABLE) ×2 IMPLANT
BSPLAT TIB 5 CMNT ROT PLAT STR (Knees) ×1 IMPLANT
CEMENT HV SMART SET (Cement) ×4 IMPLANT
COVER SURGICAL LIGHT HANDLE (MISCELLANEOUS) ×2 IMPLANT
CUFF TOURN SGL QUICK 34 (TOURNIQUET CUFF) ×1
CUFF TRNQT CYL 34X4.125X (TOURNIQUET CUFF) ×2 IMPLANT
DRAPE INCISE IOBAN 66X45 STRL (DRAPES) ×2 IMPLANT
DRAPE SHEET LG 3/4 BI-LAMINATE (DRAPES) ×2 IMPLANT
DRAPE TOP 10253 STERILE (DRAPES) ×2 IMPLANT
DRAPE U-SHAPE 47X51 STRL (DRAPES) ×2 IMPLANT
DRSG AQUACEL AG ADV 3.5X10 (GAUZE/BANDAGES/DRESSINGS) ×2 IMPLANT
DURAPREP 26ML APPLICATOR (WOUND CARE) ×4 IMPLANT
ELECT REM PT RETURN 15FT ADLT (MISCELLANEOUS) ×2 IMPLANT
GLOVE BIO SURGEON STRL SZ8 (GLOVE) ×4 IMPLANT
GLOVE BIOGEL PI IND STRL 8 (GLOVE) ×4 IMPLANT
GOWN STRL REUS W/ TWL XL LVL3 (GOWN DISPOSABLE) ×4 IMPLANT
GOWN STRL REUS W/TWL XL LVL3 (GOWN DISPOSABLE) ×2
HANDPIECE INTERPULSE COAX TIP (DISPOSABLE) ×1
HOLDER FOLEY CATH W/STRAP (MISCELLANEOUS) IMPLANT
HOOD PEEL AWAY T7 (MISCELLANEOUS) ×6 IMPLANT
KIT TURNOVER KIT A (KITS) IMPLANT
MANIFOLD NEPTUNE II (INSTRUMENTS) ×2 IMPLANT
NEEDLE HYPO 22GX1.5 SAFETY (NEEDLE) ×2 IMPLANT
NS IRRIG 1000ML POUR BTL (IV SOLUTION) ×2 IMPLANT
PACK TOTAL KNEE CUSTOM (KITS) ×2 IMPLANT
PAD ARMBOARD 7.5X6 YLW CONV (MISCELLANEOUS) ×2 IMPLANT
PIN STEINMAN FIXATION KNEE (PIN) IMPLANT
PROTECTOR NERVE ULNAR (MISCELLANEOUS) ×2 IMPLANT
SET HNDPC FAN SPRY TIP SCT (DISPOSABLE) ×2 IMPLANT
SPIKE FLUID TRANSFER (MISCELLANEOUS) ×4 IMPLANT
SUT ETHIBOND NAB CT1 #1 30IN (SUTURE) ×4 IMPLANT
SUT VIC AB 0 CT1 36 (SUTURE) ×2 IMPLANT
SUT VIC AB 2-0 CT1 27 (SUTURE) ×1
SUT VIC AB 2-0 CT1 TAPERPNT 27 (SUTURE) ×2 IMPLANT
SUT VICRYL AB 3-0 FS1 BRD 27IN (SUTURE) ×2 IMPLANT
SUT VLOC 180 0 24IN GS25 (SUTURE) ×2 IMPLANT
TIBIAL BASE ROT PLAT SZ 5 KNEE (Knees) ×1 IMPLANT
TRAY FOLEY MTR SLVR 16FR STAT (SET/KITS/TRAYS/PACK) IMPLANT
WATER STERILE IRR 1000ML POUR (IV SOLUTION) ×2 IMPLANT
WRAP KNEE MAXI GEL POST OP (GAUZE/BANDAGES/DRESSINGS) ×2 IMPLANT
YANKAUER SUCT BULB TIP NO VENT (SUCTIONS) ×2 IMPLANT

## 2022-03-15 NOTE — Interval H&P Note (Signed)
History and Physical Interval Note:  03/15/2022 8:44 AM  Breanna Wolfe  has presented today for surgery, with the diagnosis of RIGHT KNEE DEGENERATIVE JOINT DISEASE.  The various methods of treatment have been discussed with the patient and family. After consideration of risks, benefits and other options for treatment, the patient has consented to  Procedure(s): RIGHT TOTAL KNEE ARTHROPLASTY (Right) as a surgical intervention.  The patient's history has been reviewed, patient examined, no change in status, stable for surgery.  I have reviewed the patient's chart and labs.  Questions were answered to the patient's satisfaction.     Hessie Dibble

## 2022-03-15 NOTE — Evaluation (Signed)
Physical Therapy Evaluation Patient Details Name: Breanna Wolfe MRN: [id number] DOB: 1951-01-09 Today's Date: 03/15/2022  History of Present Illness  71 y.o. female admitted 03/15/22 for R TKA. PMH: HTN, depression, anxiety, COPD  Clinical Impression  Pt ambulated 63' with RW, no loss of balance. Stair training completed. Pt demonstrates good understanding of HEP. She is ready to DC home from a PT standpoint.        Recommendations for follow up therapy are one component of a multi-disciplinary discharge planning process, led by the attending physician.  Recommendations may be updated based on patient status, additional functional criteria and insurance authorization.  Follow Up Recommendations Follow physician's recommendations for discharge plan and follow up therapies      Assistance Recommended at Discharge Intermittent Supervision/Assistance  Patient can return home with the following  A little help with bathing/dressing/bathroom;Assistance with cooking/housework;Assist for transportation;Help with stairs or ramp for entrance    Equipment Recommendations Rolling walker (2 wheels)  Recommendations for Other Services       Functional Status Assessment Patient has had a recent decline in their functional status and demonstrates the ability to make significant improvements in function in a reasonable and predictable amount of time.     Precautions / Restrictions Precautions Precautions: Knee Precaution Booklet Issued: Yes (comment) Precaution Comments: reviewed no pillow under knee Restrictions Weight Bearing Restrictions: No      Mobility  Bed Mobility               General bed mobility comments: up in recliner    Transfers Overall transfer level: Needs assistance Equipment used: Rolling walker (2 wheels) Transfers: Sit to/from Stand Sit to Stand: Min guard           General transfer comment: VCs hand placement    Ambulation/Gait Ambulation/Gait  assistance: Supervision Gait Distance (Feet): 90 Feet Assistive device: Rolling walker (2 wheels) Gait Pattern/deviations: Step-to pattern, Decreased step length - left, Decreased stance time - right Gait velocity: decr     General Gait Details: VCs sequencing and positioning in RW  Stairs Stairs: Yes Stairs assistance: Min guard Stair Management: One rail Left, Forwards, Step to pattern Number of Stairs: 3 General stair comments: VCs sequencing, spouse present  Wheelchair Mobility    Modified Rankin (Stroke Patients Only)       Balance Overall balance assessment: Modified Independent                                           Pertinent Vitals/Pain Pain Assessment Pain Assessment: 0-10 Pain Score: 8  Pain Location: R knee Pain Descriptors / Indicators: Sore Pain Intervention(s): Limited activity within patient's tolerance, Monitored during session, Premedicated before session, Ice applied    Home Living Family/patient expects to be discharged to:: Private residence Living Arrangements: Spouse/significant other Available Help at Discharge: Family;Available 24 hours/day Type of Home: House Home Access: Stairs to enter Entrance Stairs-Rails: Left Entrance Stairs-Number of Steps: 3   Home Layout: One level Home Equipment: Rollator (4 wheels)      Prior Function Prior Level of Function : Independent/Modified Independent             Mobility Comments: walked with rollator ADLs Comments: independent     Hand Dominance        Extremity/Trunk Assessment   Upper Extremity Assessment Upper Extremity Assessment: Overall WFL for tasks assessed    Lower  Extremity Assessment Lower Extremity Assessment: RLE deficits/detail RLE Deficits / Details: SLR 3/5, knee AAROM 13-55* RLE Sensation: WNL RLE Coordination: WNL    Cervical / Trunk Assessment Cervical / Trunk Assessment: Normal  Communication   Communication: No difficulties   Cognition Arousal/Alertness: Awake/alert Behavior During Therapy: WFL for tasks assessed/performed Overall Cognitive Status: Within Functional Limits for tasks assessed                                          General Comments      Exercises Total Joint Exercises Ankle Circles/Pumps: AROM, Both, 10 reps, Supine Quad Sets: AROM, Both, 5 reps, Supine Short Arc Quad: AROM, Right, 5 reps, Supine Heel Slides: AAROM, Right, 5 reps, Supine Hip ABduction/ADduction: AAROM, Right, 5 reps, Supine Straight Leg Raises: AROM, Right, 5 reps, Supine Long Arc Quad: AROM, Right, 5 reps, Seated Knee Flexion: AAROM, Right, 5 reps, Seated   Assessment/Plan    PT Assessment All further PT needs can be met in the next venue of care  PT Problem List Decreased range of motion;Decreased strength;Decreased mobility;Pain;Decreased activity tolerance       PT Treatment Interventions      PT Goals (Current goals can be found in the Care Plan section)  Acute Rehab PT Goals Patient Stated Goal: to go shopping PT Goal Formulation: All assessment and education complete, DC therapy    Frequency       Co-evaluation               AM-PAC PT "6 Clicks" Mobility  Outcome Measure Help needed turning from your back to your side while in a flat bed without using bedrails?: None Help needed moving from lying on your back to sitting on the side of a flat bed without using bedrails?: A Little Help needed moving to and from a bed to a chair (including a wheelchair)?: A Little Help needed standing up from a chair using your arms (e.g., wheelchair or bedside chair)?: A Little Help needed to walk in hospital room?: A Little Help needed climbing 3-5 steps with a railing? : A Little 6 Click Score: 19    End of Session Equipment Utilized During Treatment: Gait belt Activity Tolerance: Patient tolerated treatment well Patient left: in chair;with call bell/phone within reach;with  family/visitor present Nurse Communication: Mobility status PT Visit Diagnosis: Muscle weakness (generalized) (M62.81);Difficulty in walking, not elsewhere classified (R26.2);Pain Pain - Right/Left: Right Pain - part of body: Knee    Time: 9233-0076 PT Time Calculation (min) (ACUTE ONLY): 36 min   Charges:   PT Evaluation $PT Eval Moderate Complexity: 1 Mod PT Treatments $Gait Training: 8-22 mins        Blondell Reveal Kistler PT 03/15/2022  Acute Rehabilitation Services  Office [phone]

## 2022-03-15 NOTE — Transfer of Care (Signed)
Immediate Anesthesia Transfer of Care Note  Patient: Breanna Wolfe  Procedure(s) Performed: RIGHT TOTAL KNEE ARTHROPLASTY (Right: Knee)  Patient Location: PACU  Anesthesia Type:Spinal  Level of Consciousness: awake, alert , and oriented  Airway & Oxygen Therapy: Patient Spontanous Breathing and Patient connected to face mask oxygen  Post-op Assessment: Report given to RN and Post -op Vital signs reviewed and stable  Post vital signs: Reviewed and stable  Last Vitals:  Vitals Value Taken Time  BP 129/109 03/15/22 1128  Temp    Pulse 65 03/15/22 1129  Resp 11 03/15/22 1129  SpO2 99 % 03/15/22 1129  Vitals shown include unvalidated device data.  Last Pain:  Vitals:   03/15/22 0817  TempSrc: Oral  PainSc:       Patients Stated Pain Goal: 5 (32/12/24 8250)  Complications: No notable events documented.

## 2022-03-15 NOTE — Anesthesia Preprocedure Evaluation (Signed)
Anesthesia Evaluation  Patient identified by MRN, date of birth, ID band Patient awake    Reviewed: Allergy & Precautions, H&P , NPO status , Patient's Chart, lab work & pertinent test results  Airway Mallampati: II  TM Distance: >3 FB Neck ROM: Full    Dental no notable dental hx.    Pulmonary COPD, Patient abstained from smoking., former smoker   Pulmonary exam normal breath sounds clear to auscultation       Cardiovascular hypertension, Pt. on medications and Pt. on home beta blockers Normal cardiovascular exam Rhythm:Regular Rate:Normal     Neuro/Psych negative neurological ROS  negative psych ROS   GI/Hepatic Neg liver ROS,GERD  Medicated,,  Endo/Other  Hypothyroidism    Renal/GU negative Renal ROS  negative genitourinary   Musculoskeletal  (+) Arthritis , Osteoarthritis,    Abdominal   Peds negative pediatric ROS (+)  Hematology negative hematology ROS (+)   Anesthesia Other Findings   Reproductive/Obstetrics negative OB ROS                             Anesthesia Physical Anesthesia Plan  ASA: 3  Anesthesia Plan: Spinal   Post-op Pain Management: Regional block*   Induction: Intravenous  PONV Risk Score and Plan: 2 and Ondansetron, Dexamethasone, Propofol infusion and Treatment may vary due to age or medical condition  Airway Management Planned: Simple Face Mask  Additional Equipment:   Intra-op Plan:   Post-operative Plan:   Informed Consent: I have reviewed the patients History and Physical, chart, labs and discussed the procedure including the risks, benefits and alternatives for the proposed anesthesia with the patient or authorized representative who has indicated his/her understanding and acceptance.     Dental advisory given  Plan Discussed with: CRNA and Surgeon  Anesthesia Plan Comments:        Anesthesia Quick Evaluation

## 2022-03-15 NOTE — Anesthesia Procedure Notes (Signed)
Anesthesia Regional Block: Adductor canal block   Pre-Anesthetic Checklist: , timeout performed,  Correct Patient, Correct Site, Correct Laterality,  Correct Procedure, Correct Position, site marked,  Risks and benefits discussed,  Surgical consent,  Pre-op evaluation,  At surgeon's request and post-op pain management  Laterality: Right  Prep: chloraprep       Needles:  Injection technique: Single-shot  Needle Type: Echogenic Needle     Needle Length: 9cm      Additional Needles:   Procedures:,,,, ultrasound used (permanent image in chart),,    Narrative:  Start time: 03/15/2022 8:52 AM End time: 03/15/2022 8:59 AM Injection made incrementally with aspirations every 5 mL.  Performed by: Personally  Anesthesiologist: Myrtie Soman, MD  Additional Notes: Patient tolerated the procedure well without complications

## 2022-03-15 NOTE — Op Note (Signed)
PREOP DIAGNOSIS: DJD RIGHT KNEE POSTOP DIAGNOSIS: same PROCEDURE: RIGHT TKR ANESTHESIA: Spinal and MAC ATTENDING SURGEON: Hessie Dibble ASSISTANT: Loni Dolly PA  INDICATIONS FOR PROCEDURE: Breanna Wolfe is a 71 y.o. female who has struggled for a long time with pain due to degenerative arthritis of the right knee.  The patient has failed many conservative non-operative measures and at this point has pain which limits the ability to sleep and walk.  The patient is offered total knee replacement.  Informed operative consent was obtained after discussion of possible risks of anesthesia, infection, neurovascular injury, DVT, and death.  The importance of the post-operative rehabilitation protocol to optimize result was stressed extensively with the patient.  SUMMARY OF FINDINGS AND PROCEDURE:  Breanna Wolfe was taken to the operative suite where under the above anesthesia a right knee replacement was performed.  There were advanced degenerative changes and the bone quality was good.  We used the DePuy Attune system and placed size 5 femur, 5 tibia, 38 mm all polyethylene patella, and a size 6 mm spacer.  Loni Dolly PA-C assisted throughout and was invaluable to the completion of the case in that he helped retract and maintain exposure while I placed components.  He also helped close thereby minimizing OR time.  The patient was admitted for appropriate post-op care to include perioperative antibiotics and mechanical and pharmacologic measures for DVT prophylaxis.  DESCRIPTION OF PROCEDURE:  Breanna Wolfe was taken to the operative suite where the above anesthesia was applied.  The patient was positioned supine and prepped and draped in normal sterile fashion.  An appropriate time out was performed.  After the administration of kefzol pre-op antibiotic the leg was elevated and exsanguinated and a tourniquet inflated. A standard longitudinal incision was made on the anterior knee.   Dissection was carried down to the extensor mechanism.  All appropriate anti-infective measures were used including the pre-operative antibiotic, betadine impregnated drape, and closed hooded exhaust systems for each member of the surgical team.  A medial parapatellar incision was made in the extensor mechanism and the knee cap flipped and the knee flexed.  Some residual meniscal tissues were removed along with any remaining ACL/PCL tissue.  A guide was placed on the tibia and a flat cut was made on it's superior surface.  An intramedullary guide was placed in the femur and was utilized to make anterior and posterior cuts creating an appropriate flexion gap.  A second intramedullary guide was placed in the femur to make a distal cut properly balancing the knee with an extension gap equal to the flexion gap.  The three bones sized to the above mentioned sizes and the appropriate guides were placed and utilized.  A trial reduction was done and the knee easily came to full extension and the patella tracked well on flexion.  The trial components were removed and all bones were cleaned with pulsatile lavage and then dried thoroughly.  Cement was mixed and was pressurized onto the bones followed by placement of the aforementioned components.  Excess cement was trimmed and pressure was held on the components until the cement had hardened.  The tourniquet was deflated and a small amount of bleeding was controlled with cautery and pressure.  The knee was irrigated thoroughly.  The extensor mechanism was re-approximated with #1 ethibond in interrupted fashion.  The knee was flexed and the repair was solid.  The subcutaneous tissues were re-approximated with #0 and #2-0 vicryl and the skin closed with  a subcuticular stitch and steristrips.  A sterile dressing was applied.  Intraoperative fluids, EBL, and tourniquet time can be obtained from anesthesia records.  DISPOSITION:  The patient was taken to recovery room in stable  condition and scheduled to potentially go home same day depending on ability to walk and tolerate liquids.Breanna Wolfe 03/15/2022, 11:03 AM

## 2022-03-15 NOTE — Anesthesia Procedure Notes (Signed)
Anesthesia Procedure Image    

## 2022-03-15 NOTE — Anesthesia Postprocedure Evaluation (Signed)
Anesthesia Post Note  Patient: Breanna Wolfe  Procedure(s) Performed: RIGHT TOTAL KNEE ARTHROPLASTY (Right: Knee)     Patient location during evaluation: PACU Anesthesia Type: Spinal Level of consciousness: oriented and awake and alert Pain management: pain level controlled Vital Signs Assessment: post-procedure vital signs reviewed and stable Respiratory status: spontaneous breathing, respiratory function stable and patient connected to nasal cannula oxygen Cardiovascular status: blood pressure returned to baseline and stable Postop Assessment: no headache, no backache and no apparent nausea or vomiting Anesthetic complications: no  No notable events documented.  Last Vitals:  Vitals:   03/15/22 1200 03/15/22 1215  BP: (!) 154/72 (!) 150/87  Pulse: 62 66  Resp: 12 15  Temp:  36.6 C  SpO2: 93% 93%    Last Pain:  Vitals:   03/15/22 1215  TempSrc:   PainSc: 0-No pain                 Ronak Duquette S

## 2022-03-15 NOTE — Anesthesia Procedure Notes (Addendum)
Spinal  Patient location during procedure: OR End time: 03/15/2022 9:39 AM Reason for block: surgical anesthesia Staffing Performed: resident/CRNA  Anesthesiologist: Myrtie Soman, MD Resident/CRNA: Maxwell Caul, CRNA Performed by: Maxwell Caul, CRNA Authorized by: Myrtie Soman, MD   Preanesthetic Checklist Completed: patient identified, IV checked, site marked, risks and benefits discussed, surgical consent, monitors and equipment checked, pre-op evaluation and timeout performed Spinal Block Patient position: sitting Prep: DuraPrep Patient monitoring: heart rate, cardiac monitor, continuous pulse ox and blood pressure Approach: midline Location: L3-4 Injection technique: single-shot Needle Needle type: Pencan  Needle gauge: 24 G Needle length: 10 cm Assessment Sensory level: T4 Events: CSF return Additional Notes IV functioning, monitors applied to pt. Expiration date of kit checked and confirmed to be in date. Sterile prep and drape, hand hygiene and sterile gloves used. Pt was positioned and spine was prepped in sterile fashion. Skin was anesthetized with lidocaine. Free flow of clear CSF obtained prior to injecting local anesthetic into CSF x 1 attempt. Spinal needle aspirated freely following injection. Needle was carefully withdrawn, and pt tolerated procedure well. Loss of motor and sensory on exam post injection. Dr Kalman Shan at bedside during insertion.

## 2022-03-17 ENCOUNTER — Encounter (HOSPITAL_COMMUNITY): Payer: Self-pay | Admitting: Orthopaedic Surgery

## 2022-03-17 DIAGNOSIS — M25661 Stiffness of right knee, not elsewhere classified: Secondary | ICD-10-CM | POA: Diagnosis not present

## 2022-03-17 DIAGNOSIS — Z96651 Presence of right artificial knee joint: Secondary | ICD-10-CM | POA: Diagnosis not present

## 2022-03-17 DIAGNOSIS — R2689 Other abnormalities of gait and mobility: Secondary | ICD-10-CM | POA: Diagnosis not present

## 2022-03-22 DIAGNOSIS — M25661 Stiffness of right knee, not elsewhere classified: Secondary | ICD-10-CM | POA: Diagnosis not present

## 2022-03-22 DIAGNOSIS — Z96651 Presence of right artificial knee joint: Secondary | ICD-10-CM | POA: Diagnosis not present

## 2022-03-22 DIAGNOSIS — R2689 Other abnormalities of gait and mobility: Secondary | ICD-10-CM | POA: Diagnosis not present

## 2022-03-24 DIAGNOSIS — M25661 Stiffness of right knee, not elsewhere classified: Secondary | ICD-10-CM | POA: Diagnosis not present

## 2022-03-24 DIAGNOSIS — R2689 Other abnormalities of gait and mobility: Secondary | ICD-10-CM | POA: Diagnosis not present

## 2022-03-24 DIAGNOSIS — Z96651 Presence of right artificial knee joint: Secondary | ICD-10-CM | POA: Diagnosis not present

## 2022-03-25 DIAGNOSIS — M1711 Unilateral primary osteoarthritis, right knee: Secondary | ICD-10-CM | POA: Diagnosis not present

## 2022-03-28 DIAGNOSIS — R2689 Other abnormalities of gait and mobility: Secondary | ICD-10-CM | POA: Diagnosis not present

## 2022-03-28 DIAGNOSIS — M25661 Stiffness of right knee, not elsewhere classified: Secondary | ICD-10-CM | POA: Diagnosis not present

## 2022-03-28 DIAGNOSIS — Z96651 Presence of right artificial knee joint: Secondary | ICD-10-CM | POA: Diagnosis not present

## 2022-03-30 DIAGNOSIS — R2689 Other abnormalities of gait and mobility: Secondary | ICD-10-CM | POA: Diagnosis not present

## 2022-03-30 DIAGNOSIS — M25661 Stiffness of right knee, not elsewhere classified: Secondary | ICD-10-CM | POA: Diagnosis not present

## 2022-03-30 DIAGNOSIS — Z96651 Presence of right artificial knee joint: Secondary | ICD-10-CM | POA: Diagnosis not present

## 2022-04-01 DIAGNOSIS — Z96651 Presence of right artificial knee joint: Secondary | ICD-10-CM | POA: Diagnosis not present

## 2022-04-01 DIAGNOSIS — R2689 Other abnormalities of gait and mobility: Secondary | ICD-10-CM | POA: Diagnosis not present

## 2022-04-01 DIAGNOSIS — M25661 Stiffness of right knee, not elsewhere classified: Secondary | ICD-10-CM | POA: Diagnosis not present

## 2022-04-05 DIAGNOSIS — R2689 Other abnormalities of gait and mobility: Secondary | ICD-10-CM | POA: Diagnosis not present

## 2022-04-05 DIAGNOSIS — M25661 Stiffness of right knee, not elsewhere classified: Secondary | ICD-10-CM | POA: Diagnosis not present

## 2022-04-05 DIAGNOSIS — Z96651 Presence of right artificial knee joint: Secondary | ICD-10-CM | POA: Diagnosis not present

## 2022-04-07 DIAGNOSIS — Z96651 Presence of right artificial knee joint: Secondary | ICD-10-CM | POA: Diagnosis not present

## 2022-04-07 DIAGNOSIS — M25661 Stiffness of right knee, not elsewhere classified: Secondary | ICD-10-CM | POA: Diagnosis not present

## 2022-04-07 DIAGNOSIS — R2689 Other abnormalities of gait and mobility: Secondary | ICD-10-CM | POA: Diagnosis not present

## 2022-04-12 DIAGNOSIS — R2689 Other abnormalities of gait and mobility: Secondary | ICD-10-CM | POA: Diagnosis not present

## 2022-04-12 DIAGNOSIS — M25661 Stiffness of right knee, not elsewhere classified: Secondary | ICD-10-CM | POA: Diagnosis not present

## 2022-04-12 DIAGNOSIS — Z96651 Presence of right artificial knee joint: Secondary | ICD-10-CM | POA: Diagnosis not present

## 2022-04-15 DIAGNOSIS — Z96651 Presence of right artificial knee joint: Secondary | ICD-10-CM | POA: Diagnosis not present

## 2022-04-15 DIAGNOSIS — R2689 Other abnormalities of gait and mobility: Secondary | ICD-10-CM | POA: Diagnosis not present

## 2022-04-15 DIAGNOSIS — M25661 Stiffness of right knee, not elsewhere classified: Secondary | ICD-10-CM | POA: Diagnosis not present

## 2022-04-18 DIAGNOSIS — R2689 Other abnormalities of gait and mobility: Secondary | ICD-10-CM | POA: Diagnosis not present

## 2022-04-18 DIAGNOSIS — Z96651 Presence of right artificial knee joint: Secondary | ICD-10-CM | POA: Diagnosis not present

## 2022-04-18 DIAGNOSIS — M25661 Stiffness of right knee, not elsewhere classified: Secondary | ICD-10-CM | POA: Diagnosis not present

## 2022-04-21 DIAGNOSIS — H35033 Hypertensive retinopathy, bilateral: Secondary | ICD-10-CM | POA: Diagnosis not present

## 2022-04-22 DIAGNOSIS — Z96651 Presence of right artificial knee joint: Secondary | ICD-10-CM | POA: Diagnosis not present

## 2022-04-22 DIAGNOSIS — R2689 Other abnormalities of gait and mobility: Secondary | ICD-10-CM | POA: Diagnosis not present

## 2022-04-22 DIAGNOSIS — M25661 Stiffness of right knee, not elsewhere classified: Secondary | ICD-10-CM | POA: Diagnosis not present

## 2022-04-26 ENCOUNTER — Encounter: Payer: Self-pay | Admitting: Gastroenterology

## 2022-04-26 DIAGNOSIS — R2689 Other abnormalities of gait and mobility: Secondary | ICD-10-CM | POA: Diagnosis not present

## 2022-04-26 DIAGNOSIS — Z96651 Presence of right artificial knee joint: Secondary | ICD-10-CM | POA: Diagnosis not present

## 2022-04-26 DIAGNOSIS — M25661 Stiffness of right knee, not elsewhere classified: Secondary | ICD-10-CM | POA: Diagnosis not present

## 2022-04-28 DIAGNOSIS — Z96651 Presence of right artificial knee joint: Secondary | ICD-10-CM | POA: Diagnosis not present

## 2022-04-28 DIAGNOSIS — R2689 Other abnormalities of gait and mobility: Secondary | ICD-10-CM | POA: Diagnosis not present

## 2022-04-28 DIAGNOSIS — M25661 Stiffness of right knee, not elsewhere classified: Secondary | ICD-10-CM | POA: Diagnosis not present

## 2022-05-03 DIAGNOSIS — R2689 Other abnormalities of gait and mobility: Secondary | ICD-10-CM | POA: Diagnosis not present

## 2022-05-03 DIAGNOSIS — Z96651 Presence of right artificial knee joint: Secondary | ICD-10-CM | POA: Diagnosis not present

## 2022-05-03 DIAGNOSIS — M25661 Stiffness of right knee, not elsewhere classified: Secondary | ICD-10-CM | POA: Diagnosis not present

## 2022-05-06 DIAGNOSIS — M25661 Stiffness of right knee, not elsewhere classified: Secondary | ICD-10-CM | POA: Diagnosis not present

## 2022-05-06 DIAGNOSIS — Z96651 Presence of right artificial knee joint: Secondary | ICD-10-CM | POA: Diagnosis not present

## 2022-05-06 DIAGNOSIS — R2689 Other abnormalities of gait and mobility: Secondary | ICD-10-CM | POA: Diagnosis not present

## 2022-05-10 DIAGNOSIS — R2689 Other abnormalities of gait and mobility: Secondary | ICD-10-CM | POA: Diagnosis not present

## 2022-05-10 DIAGNOSIS — Z96651 Presence of right artificial knee joint: Secondary | ICD-10-CM | POA: Diagnosis not present

## 2022-05-10 DIAGNOSIS — M25661 Stiffness of right knee, not elsewhere classified: Secondary | ICD-10-CM | POA: Diagnosis not present

## 2022-05-12 DIAGNOSIS — Z96651 Presence of right artificial knee joint: Secondary | ICD-10-CM | POA: Diagnosis not present

## 2022-05-12 DIAGNOSIS — M25661 Stiffness of right knee, not elsewhere classified: Secondary | ICD-10-CM | POA: Diagnosis not present

## 2022-05-12 DIAGNOSIS — R2689 Other abnormalities of gait and mobility: Secondary | ICD-10-CM | POA: Diagnosis not present

## 2022-06-15 DIAGNOSIS — M25561 Pain in right knee: Secondary | ICD-10-CM | POA: Diagnosis not present

## 2022-07-26 ENCOUNTER — Encounter: Payer: Self-pay | Admitting: Gastroenterology

## 2022-08-03 DIAGNOSIS — R32 Unspecified urinary incontinence: Secondary | ICD-10-CM | POA: Diagnosis not present

## 2022-08-03 DIAGNOSIS — N3000 Acute cystitis without hematuria: Secondary | ICD-10-CM | POA: Diagnosis not present

## 2022-08-15 ENCOUNTER — Ambulatory Visit (AMBULATORY_SURGERY_CENTER): Payer: Medicare PPO

## 2022-08-15 VITALS — Ht 65.75 in | Wt 146.0 lb

## 2022-08-15 DIAGNOSIS — Z86010 Personal history of colonic polyps: Secondary | ICD-10-CM

## 2022-08-15 MED ORDER — NA SULFATE-K SULFATE-MG SULF 17.5-3.13-1.6 GM/177ML PO SOLN: 1.0000 | Freq: Once | ORAL | 0 refills | Status: AC

## 2022-08-15 NOTE — Progress Notes (Signed)
Pre visit completed via phone call; Patient verified name, DOB, and address;  No egg or soy allergy known to patient;  No issues known to pt with past sedation with any surgeries or procedures; Patient denies ever being told they had issues or difficulty with intubation;  No FH of Malignant Hyperthermia; Pt is not on diet pills; Pt is not on home 02;  Pt is not on blood thinners;  Pt denies issues with constipation;  No A fib or A flutter; Have any cardiac testing pending--NO Pt instructed to use [email or web address] or GoodRx for a price reduction on prep;   Insurance verified during PV appt=[employer]  Patient's chart reviewed by Cathlyn Parsons CNRA prior to previsit and patient appropriate for the [facility].  Previsit completed and red dot placed by patient's name on their procedure day (on provider's schedule).    Instructions and GoodRx coupon printed and mailed to patient in addition to being sent to MyChart;

## 2022-08-24 ENCOUNTER — Encounter: Payer: Self-pay | Admitting: Gastroenterology

## 2022-09-12 ENCOUNTER — Encounter: Payer: Medicare PPO | Admitting: Gastroenterology

## 2022-10-21 ENCOUNTER — Telehealth: Payer: Self-pay | Admitting: Gastroenterology

## 2022-10-21 DIAGNOSIS — M79661 Pain in right lower leg: Secondary | ICD-10-CM | POA: Diagnosis not present

## 2022-10-21 NOTE — Telephone Encounter (Signed)
Patient called in she is schedule for a Colonoscopy on 7/15 and want to know if she can take her Aspirin.Please advise

## 2022-10-21 NOTE — Telephone Encounter (Signed)
Returned pt's call. Instructed that she did not need to hold the Aspirin 81mg . Pt verbalized understanding.

## 2022-10-24 ENCOUNTER — Encounter: Payer: Self-pay | Admitting: Gastroenterology

## 2022-10-24 ENCOUNTER — Ambulatory Visit (AMBULATORY_SURGERY_CENTER): Payer: Medicare PPO | Admitting: Gastroenterology

## 2022-10-24 VITALS — BP 138/59 | HR 70 | Temp 98.7°F | Resp 12 | Ht 65.75 in | Wt 146.0 lb

## 2022-10-24 DIAGNOSIS — Z86010 Personal history of colonic polyps: Secondary | ICD-10-CM | POA: Diagnosis not present

## 2022-10-24 DIAGNOSIS — J449 Chronic obstructive pulmonary disease, unspecified: Secondary | ICD-10-CM | POA: Diagnosis not present

## 2022-10-24 DIAGNOSIS — Z09 Encounter for follow-up examination after completed treatment for conditions other than malignant neoplasm: Secondary | ICD-10-CM

## 2022-10-24 DIAGNOSIS — F419 Anxiety disorder, unspecified: Secondary | ICD-10-CM | POA: Diagnosis not present

## 2022-10-24 DIAGNOSIS — D123 Benign neoplasm of transverse colon: Secondary | ICD-10-CM | POA: Diagnosis not present

## 2022-10-24 DIAGNOSIS — D120 Benign neoplasm of cecum: Secondary | ICD-10-CM | POA: Diagnosis not present

## 2022-10-24 DIAGNOSIS — I10 Essential (primary) hypertension: Secondary | ICD-10-CM | POA: Diagnosis not present

## 2022-10-24 DIAGNOSIS — F32A Depression, unspecified: Secondary | ICD-10-CM | POA: Diagnosis not present

## 2022-10-24 DIAGNOSIS — K635 Polyp of colon: Secondary | ICD-10-CM | POA: Diagnosis not present

## 2022-10-24 MED ORDER — SODIUM CHLORIDE 0.9 % IV SOLN: 500.0000 mL | Freq: Once | INTRAVENOUS | Status: DC

## 2022-10-24 NOTE — Progress Notes (Signed)
[facility] History and Physical   Primary Care Physician:  Breanna Palmer, MD   Reason for Procedure:   History of colon polyps  Plan:    colonoscopy     HPI: Breanna Wolfe is a 72 y.o. female  here for colonoscopy  surveillance - history of advanced SSP removed 2015, last exam in 2018 - one adenoma. Mother had colon cancer dx age 67.   Marland Kitchen Patient denies any bowel symptoms at this time.  Otherwise feels well without any cardiopulmonary symptoms.   I have discussed risks / benefits of anesthesia and endoscopic procedure with Breanna Wolfe and they wish to proceed with the exams as outlined today.    Past Medical History:  Diagnosis Date   Anxiety    on meds   Cataract    bilateral - sx in future   COPD (chronic obstructive pulmonary disease) (HCC)    mild   Depression    on meds   Diverticulosis    DJD (degenerative joint disease)    GERD (gastroesophageal reflux disease)    Hyperlipemia    on meds   Hypertension    on meds   Seasonal allergies    Thyroid disease    on meds    Past Surgical History:  Procedure Laterality Date   CARPAL TUNNEL RELEASE  2013   Bilateral    COLONOSCOPY  2018   SA-MAC-suprep (good)-aingle angiodysplastic , anal papillae hypertorphied, int hems   TONSILLECTOMY     as a child   TOTAL KNEE ARTHROPLASTY Right 03/15/2022   Procedure: RIGHT TOTAL KNEE ARTHROPLASTY;  Surgeon: Breanna Corning, MD;  Location: [facility];  Service: Orthopedics;  Laterality: Right;   TUBAL LIGATION  04/11/1981    Prior to Admission medications   Medication Sig Start Date End Date Taking? Authorizing Provider  amLODipine (NORVASC) 5 MG tablet Take 5 mg by mouth at bedtime.   Yes [provider]  aspirin EC 81 MG tablet Take 1 tablet (81 mg total) by mouth 2 (two) times daily after a meal. 03/15/22  Yes Breanna Wolfe, Breanna Castilla, PA-C  atorvastatin (LIPITOR) 10 MG tablet Take 10 mg by mouth at bedtime. 02/24/16  Yes [provider]   levothyroxine (SYNTHROID, LEVOTHROID) 100 MCG tablet Take 100 mcg by mouth daily before breakfast. 12/09/16  Yes [provider]  metoprolol succinate (TOPROL-XL) 100 MG 24 hr tablet Take 100 mg by mouth at bedtime. 02/27/16  Yes [provider]  omeprazole (PRILOSEC) 40 MG capsule Take 40 mg by mouth 2 (two) times daily as needed (indigestion/heartburn.). One tablet by mouth once daily 08/23/10  Yes [provider]  Polyvinyl Alcohol-Povidone (CLEAR EYES NATURAL TEARS) 5-6 MG/ML SOLN Place 1-2 drops into both eyes 3 (three) times daily as needed (dry/irritated eyes).   Yes [provider]  sertraline (ZOLOFT) 100 MG tablet Take 100 mg by mouth at bedtime. As needed 10/14/10  Yes [provider]  acetaminophen (TYLENOL) 500 MG tablet Take 500-1,000 mg by mouth every 6 (six) hours as needed (pain).    [provider]  celecoxib (CELEBREX) 200 MG capsule Take 200 mg by mouth daily. 10/06/21   [provider]    Current Outpatient Medications  Medication Sig Dispense Refill   amLODipine (NORVASC) 5 MG tablet Take 5 mg by mouth at bedtime.     aspirin EC 81 MG tablet Take 1 tablet (81 mg total) by mouth 2 (two) times daily after a meal. 30 tablet 0  atorvastatin (LIPITOR) 10 MG tablet Take 10 mg by mouth at bedtime.     levothyroxine (SYNTHROID, LEVOTHROID) 100 MCG tablet Take 100 mcg by mouth daily before breakfast.  5   metoprolol succinate (TOPROL-XL) 100 MG 24 hr tablet Take 100 mg by mouth at bedtime.     omeprazole (PRILOSEC) 40 MG capsule Take 40 mg by mouth 2 (two) times daily as needed (indigestion/heartburn.). One tablet by mouth once daily     Polyvinyl Alcohol-Povidone (CLEAR EYES NATURAL TEARS) 5-6 MG/ML SOLN Place 1-2 drops into both eyes 3 (three) times daily as needed (dry/irritated eyes).     sertraline (ZOLOFT) 100 MG tablet Take 100 mg by mouth at bedtime. As needed     acetaminophen (TYLENOL) 500 MG tablet Take 500-1,000  mg by mouth every 6 (six) hours as needed (pain).     celecoxib (CELEBREX) 200 MG capsule Take 200 mg by mouth daily.     Current Facility-Administered Medications  Medication Dose Route Frequency Provider Last Rate Last Admin   0.9 %  sodium chloride infusion  500 mL Intravenous Once Breanna Wolfe, Breanna Rayas, MD        Allergies as of 10/24/2022 - Review Complete 10/24/2022  Allergen Reaction Noted   Iohexol Other (See Comments) and Hypertension 10/21/2010   Amoxicillin Diarrhea 04/30/2018   Amoxicillin-pot clavulanate Diarrhea 08/15/2022   Doxycycline Diarrhea 08/15/2022   Lisinopril Cough 11/12/2019   Rosuvastatin  08/15/2022   Simvastatin  08/15/2022   Sulfa drugs cross reactors  10/21/2010   Sulfamethoxazole-trimethoprim Nausea Only 08/15/2022    Family History  Problem Relation Age of Onset   Colon polyps Mother 96   Rectal cancer Mother 51   Breast cancer Maternal Aunt    Esophageal cancer Neg Hx    Stomach cancer Neg Hx    Colon cancer Neg Hx     Social History   Socioeconomic History   Marital status: Married    Spouse name: Not on file   Number of children: 2   Years of education: Not on file   Highest education level: Not on file  Occupational History   Occupation: retired Solicitor of [employer]  Tobacco Use   Smoking status: Every Day    Current packs/day: 0.00    Average packs/day: 0.5 packs/day for 40.0 years (20.0 ttl pk-yrs)    Types: Cigarettes    Start date: 02/01/1969    Last attempt to quit: 02/01/2009    Years since quitting: 13.7   Smokeless tobacco: Never  Vaping Use   Vaping status: Never Used  Substance and Sexual Activity   Alcohol use: Yes    Comment: occasional   Drug use: No   Sexual activity: Not Currently    Birth control/protection: Post-menopausal  Other Topics Concern   Not on file  Social History Narrative   Not on file   Social Determinants of Health   Financial Resource Strain: Not on file  Food Insecurity: Not on  file  Transportation Needs: Not on file  Physical Activity: Not on file  Stress: Not on file  Social Connections: Not on file  Intimate Partner Violence: Not on file    Review of Systems: All other review of systems negative except as mentioned in the HPI.  Physical Exam: Vital signs BP (!) 136/96   Pulse 88   Temp 98.7 F (37.1 C) (Temporal)   Ht 5' 5.75" (1.67 m)   Wt 146 lb (66.2 kg)   SpO2 96%   BMI 23.74  kg/m   General:   Alert,  Well-developed, pleasant and cooperative in NAD Lungs:  Clear throughout to auscultation.   Heart:  Regular rate and rhythm Abdomen:  Soft, nontender and nondistended.   Neuro/Psych:  Alert and cooperative. Normal mood and affect. A and O x 3  Harlin Rain, MD [facility]

## 2022-10-24 NOTE — Op Note (Signed)
[facility] Patient Name: Breanna Wolfe Procedure Date: 10/24/2022 1:27 PM MRN: [id number] Endoscopist: Viviann Spare P. Adela Lank , MD, [id number] Age: 72 Referring MD:  Date of Birth: 09/26/1950 Gender: Female Account #: [id number] Procedure:                Colonoscopy Indications:              High risk colon cancer surveillance: Personal                            history of colonic polyps - advanced SSP removed in                            2015, last exam 2018 with one small adenoma Medicines:                Monitored Anesthesia Care Procedure:                Pre-Anesthesia Assessment:                           - Prior to the procedure, a History and Physical                            was performed, and patient medications and                            allergies were reviewed. The patient's tolerance of                            previous anesthesia was also reviewed. The risks                            and benefits of the procedure and the sedation                            options and risks were discussed with the patient.                            All questions were answered, and informed consent                            was obtained. Prior Anticoagulants: The patient has                            taken no anticoagulant or antiplatelet agents. ASA                            Grade Assessment: II - A patient with mild systemic                            disease. After reviewing the risks and benefits,                            the patient was deemed in satisfactory condition to  undergo the procedure.                           After obtaining informed consent, the colonoscope                            was passed under direct vision. Throughout the                            procedure, the patient's blood pressure, pulse, and                            oxygen saturations were monitored continuously. The                             PCF-HQ190L Colonoscope [id number] was introduced                            through the anus and advanced to the the cecum,                            identified by appendiceal orifice and ileocecal                            valve. The colonoscopy was performed without                            difficulty. The patient tolerated the procedure                            well. The quality of the bowel preparation was                            good. The ileocecal valve, appendiceal orifice, and                            rectum were photographed. Scope In: 1:32:57 PM Scope Out: 1:49:36 PM Scope Withdrawal Time: 0 hours 14 minutes 47 seconds  Total Procedure Duration: 0 hours 16 minutes 39 seconds  Findings:                 The perianal and digital rectal examinations were                            normal.                           Two sessile polyps were found in the cecum. The                            polyps were 2 to 3 mm in size. These polyps were                            removed with a cold snare. Resection and retrieval  were complete.                           A 3 mm polyp was found in the transverse colon. The                            polyp was flat. The polyp was removed with a cold                            snare. Resection and retrieval were complete.                           A few small-mouthed diverticula were found in the                            sigmoid colon.                           Anal papilla(e) were hypertrophied.                           The exam was otherwise without abnormality. Complications:            No immediate complications. Estimated blood loss:                            Minimal. Estimated Blood Loss:     Estimated blood loss was minimal. Impression:               - Two 2 to 3 mm polyps in the cecum, removed with a                            cold snare. Resected and retrieved.                           - One 3 mm polyp  in the transverse colon, removed                            with a cold snare. Resected and retrieved.                           - Diverticulosis in the sigmoid colon.                           - Anal papilla(e) were hypertrophied.                           - The examination was otherwise normal. Recommendation:           - Patient has a contact number available for                            emergencies. The signs and symptoms of potential                            delayed complications  were discussed with the                            patient. Return to normal activities tomorrow.                            Written discharge instructions were provided to the                            patient.                           - Resume previous diet.                           - Continue present medications.                           - Await pathology results. Viviann Spare P. Ellinore Merced, MD 10/24/2022 1:53:45 PM This report has been signed electronically.

## 2022-10-24 NOTE — Progress Notes (Signed)
 Pt's states no medical or surgical changes since previsit or office visit. 

## 2022-10-24 NOTE — Progress Notes (Signed)
 Called to room to assist during endoscopic procedure.  Patient ID and intended procedure confirmed with present staff. Received instructions for my participation in the procedure from the performing physician.  

## 2022-10-24 NOTE — Patient Instructions (Signed)

## 2022-10-25 ENCOUNTER — Telehealth: Payer: Self-pay | Admitting: *Deleted

## 2022-10-25 NOTE — Telephone Encounter (Signed)
  Follow up Call-     10/24/2022   12:50 PM  Call back number  Post procedure Call Back phone  # [phone]  Permission to leave phone message Yes     Patient questions:  Do you have a fever, pain , or abdominal swelling? No. Pain Score  0 *  Have you tolerated food without any problems? Yes.    Have you been able to return to your normal activities? Yes.    Do you have any questions about your discharge instructions: Diet   No. Medications  No. Follow up visit  No.  Do you have questions or concerns about your Care? No.  Actions: * If pain score is 4 or above: No action needed, pain <4.

## 2022-11-11 ENCOUNTER — Other Ambulatory Visit: Payer: Self-pay | Admitting: Family Medicine

## 2022-11-11 DIAGNOSIS — Z1231 Encounter for screening mammogram for malignant neoplasm of breast: Secondary | ICD-10-CM

## 2022-12-01 ENCOUNTER — Ambulatory Visit
Admission: RE | Admit: 2022-12-01 | Discharge: 2022-12-01 | Disposition: A | Discharge status: Home / Self Care | Payer: Medicare PPO | Source: Ambulatory Visit | Attending: Family Medicine | Admitting: Family Medicine

## 2022-12-01 DIAGNOSIS — Z1231 Encounter for screening mammogram for malignant neoplasm of breast: Secondary | ICD-10-CM

## 2022-12-20 DIAGNOSIS — J449 Chronic obstructive pulmonary disease, unspecified: Secondary | ICD-10-CM | POA: Diagnosis not present

## 2022-12-20 DIAGNOSIS — Z720 Tobacco use: Secondary | ICD-10-CM | POA: Diagnosis not present

## 2022-12-20 DIAGNOSIS — J0101 Acute recurrent maxillary sinusitis: Secondary | ICD-10-CM | POA: Diagnosis not present

## 2023-01-10 DIAGNOSIS — E039 Hypothyroidism, unspecified: Secondary | ICD-10-CM | POA: Diagnosis not present

## 2023-01-10 DIAGNOSIS — R7303 Prediabetes: Secondary | ICD-10-CM | POA: Diagnosis not present

## 2023-01-10 DIAGNOSIS — E785 Hyperlipidemia, unspecified: Secondary | ICD-10-CM | POA: Diagnosis not present

## 2023-01-10 DIAGNOSIS — Z79899 Other long term (current) drug therapy: Secondary | ICD-10-CM | POA: Diagnosis not present

## 2023-01-10 DIAGNOSIS — Z0000 Encounter for general adult medical examination without abnormal findings: Secondary | ICD-10-CM | POA: Diagnosis not present

## 2023-01-10 DIAGNOSIS — E559 Vitamin D deficiency, unspecified: Secondary | ICD-10-CM | POA: Diagnosis not present

## 2023-03-20 DIAGNOSIS — M25561 Pain in right knee: Secondary | ICD-10-CM | POA: Diagnosis not present

## 2023-04-27 DIAGNOSIS — H2513 Age-related nuclear cataract, bilateral: Secondary | ICD-10-CM | POA: Diagnosis not present

## 2023-04-27 DIAGNOSIS — H5111 Convergence insufficiency: Secondary | ICD-10-CM | POA: Diagnosis not present

## 2023-04-27 DIAGNOSIS — H35033 Hypertensive retinopathy, bilateral: Secondary | ICD-10-CM | POA: Diagnosis not present

## 2023-04-27 DIAGNOSIS — D23122 Other benign neoplasm of skin of left lower eyelid, including canthus: Secondary | ICD-10-CM | POA: Diagnosis not present

## 2023-04-27 DIAGNOSIS — H5203 Hypermetropia, bilateral: Secondary | ICD-10-CM | POA: Diagnosis not present

## 2023-04-28 DIAGNOSIS — L578 Other skin changes due to chronic exposure to nonionizing radiation: Secondary | ICD-10-CM | POA: Diagnosis not present

## 2023-04-28 DIAGNOSIS — L820 Inflamed seborrheic keratosis: Secondary | ICD-10-CM | POA: Diagnosis not present

## 2023-04-28 DIAGNOSIS — D485 Neoplasm of uncertain behavior of skin: Secondary | ICD-10-CM | POA: Diagnosis not present

## 2023-04-28 DIAGNOSIS — L814 Other melanin hyperpigmentation: Secondary | ICD-10-CM | POA: Diagnosis not present

## 2023-04-28 DIAGNOSIS — L821 Other seborrheic keratosis: Secondary | ICD-10-CM | POA: Diagnosis not present

## 2023-06-06 DIAGNOSIS — D485 Neoplasm of uncertain behavior of skin: Secondary | ICD-10-CM | POA: Diagnosis not present

## 2023-06-06 DIAGNOSIS — H01115 Allergic dermatitis of left lower eyelid: Secondary | ICD-10-CM | POA: Diagnosis not present

## 2023-06-13 DIAGNOSIS — I10 Essential (primary) hypertension: Secondary | ICD-10-CM | POA: Diagnosis not present

## 2023-06-13 DIAGNOSIS — N3000 Acute cystitis without hematuria: Secondary | ICD-10-CM | POA: Diagnosis not present

## 2023-06-27 DIAGNOSIS — I10 Essential (primary) hypertension: Secondary | ICD-10-CM | POA: Diagnosis not present

## 2023-07-25 DIAGNOSIS — H25043 Posterior subcapsular polar age-related cataract, bilateral: Secondary | ICD-10-CM | POA: Diagnosis not present

## 2023-07-25 DIAGNOSIS — H25013 Cortical age-related cataract, bilateral: Secondary | ICD-10-CM | POA: Diagnosis not present

## 2023-07-25 DIAGNOSIS — H18413 Arcus senilis, bilateral: Secondary | ICD-10-CM | POA: Diagnosis not present

## 2023-07-25 DIAGNOSIS — H2513 Age-related nuclear cataract, bilateral: Secondary | ICD-10-CM | POA: Diagnosis not present

## 2023-07-25 DIAGNOSIS — H2512 Age-related nuclear cataract, left eye: Secondary | ICD-10-CM | POA: Diagnosis not present

## 2023-09-06 DIAGNOSIS — H2512 Age-related nuclear cataract, left eye: Secondary | ICD-10-CM | POA: Diagnosis not present

## 2023-09-07 DIAGNOSIS — H2511 Age-related nuclear cataract, right eye: Secondary | ICD-10-CM | POA: Diagnosis not present

## 2023-09-20 DIAGNOSIS — M19072 Primary osteoarthritis, left ankle and foot: Secondary | ICD-10-CM | POA: Diagnosis not present

## 2023-09-21 DIAGNOSIS — M2012 Hallux valgus (acquired), left foot: Secondary | ICD-10-CM | POA: Diagnosis not present

## 2023-09-21 DIAGNOSIS — M79662 Pain in left lower leg: Secondary | ICD-10-CM | POA: Diagnosis not present

## 2023-09-21 DIAGNOSIS — M2011 Hallux valgus (acquired), right foot: Secondary | ICD-10-CM | POA: Diagnosis not present

## 2023-10-04 DIAGNOSIS — H2511 Age-related nuclear cataract, right eye: Secondary | ICD-10-CM | POA: Diagnosis not present

## 2023-10-04 DIAGNOSIS — H2513 Age-related nuclear cataract, bilateral: Secondary | ICD-10-CM | POA: Diagnosis not present

## 2023-10-23 ENCOUNTER — Telehealth: Payer: Self-pay | Admitting: Family Medicine

## 2023-10-23 NOTE — Telephone Encounter (Signed)
 Disregard.

## 2023-10-27 ENCOUNTER — Ambulatory Visit: Admitting: Family Medicine

## 2023-10-30 DIAGNOSIS — H524 Presbyopia: Secondary | ICD-10-CM | POA: Diagnosis not present

## 2023-10-31 ENCOUNTER — Ambulatory Visit: Admitting: Family Medicine

## 2023-10-31 ENCOUNTER — Encounter: Payer: Self-pay | Admitting: Family Medicine

## 2023-10-31 VITALS — BP 110/70 | HR 71 | Temp 98.6°F | Ht 64.5 in | Wt 144.6 lb

## 2023-10-31 DIAGNOSIS — K219 Gastro-esophageal reflux disease without esophagitis: Secondary | ICD-10-CM | POA: Diagnosis not present

## 2023-10-31 DIAGNOSIS — M1990 Unspecified osteoarthritis, unspecified site: Secondary | ICD-10-CM | POA: Insufficient documentation

## 2023-10-31 DIAGNOSIS — E039 Hypothyroidism, unspecified: Secondary | ICD-10-CM | POA: Diagnosis not present

## 2023-10-31 DIAGNOSIS — M150 Primary generalized (osteo)arthritis: Secondary | ICD-10-CM

## 2023-10-31 DIAGNOSIS — I10 Essential (primary) hypertension: Secondary | ICD-10-CM | POA: Diagnosis not present

## 2023-10-31 DIAGNOSIS — E785 Hyperlipidemia, unspecified: Secondary | ICD-10-CM | POA: Diagnosis not present

## 2023-10-31 NOTE — Progress Notes (Signed)
   Subjective:    Patient ID: Breanna Wolfe, female    DOB: 1950-07-23, 73 y.o.   MRN: [id number]  HPI Here for an intro visit after transferring from the care of Dr. Reena Duck. I already see her husband Ron. In general she is doing well. Her OA bothers her a lot, with her hands being the most affected. Her HTN has been stable. Her GERD is well controlled. She gets yearly mammograms. She will be due for another colonoscopy in 2027. Her last well exam was in October 2024.    Review of Systems  Constitutional: Negative.   Respiratory: Negative.    Cardiovascular: Negative.   Gastrointestinal: Negative.   Genitourinary: Negative.   Musculoskeletal:  Positive for arthralgias.  Neurological: Negative.        Objective:   Physical Exam Constitutional:      Appearance: Normal appearance.  Cardiovascular:     Rate and Rhythm: Normal rate and regular rhythm.     Pulses: Normal pulses.     Heart sounds: Normal heart sounds.  Pulmonary:     Effort: Pulmonary effort is normal.     Breath sounds: Normal breath sounds.  Musculoskeletal:     Right lower leg: No edema.     Left lower leg: No edema.     Comments: She has large Heberden's nodes and Bouchard's nodes on every finger   Neurological:     Mental Status: She is alert.           Assessment & Plan:  Intro visit for this patient with OA, GERD, dyslipidemia, and HTN. She has plenty of refills at this time. I did suggest she increase the Celebrex to BID. We wil plan on giving her a well exam in mid October.  Garnette Olmsted, MD

## 2023-11-09 ENCOUNTER — Other Ambulatory Visit: Payer: Self-pay | Admitting: Family Medicine

## 2023-11-09 DIAGNOSIS — Z1231 Encounter for screening mammogram for malignant neoplasm of breast: Secondary | ICD-10-CM

## 2023-12-02 ENCOUNTER — Other Ambulatory Visit: Payer: Self-pay | Admitting: Family Medicine

## 2023-12-05 ENCOUNTER — Ambulatory Visit
Admission: RE | Admit: 2023-12-05 | Discharge: 2023-12-05 | Disposition: A | Discharge status: Home / Self Care | Source: Ambulatory Visit | Attending: Family Medicine | Admitting: Family Medicine

## 2023-12-05 DIAGNOSIS — Z1231 Encounter for screening mammogram for malignant neoplasm of breast: Secondary | ICD-10-CM

## 2024-01-11 ENCOUNTER — Telehealth: Payer: Self-pay | Admitting: Family Medicine

## 2024-01-11 DIAGNOSIS — M150 Primary generalized (osteo)arthritis: Secondary | ICD-10-CM

## 2024-01-11 NOTE — Telephone Encounter (Signed)
 Copied from CRM [id number]. Topic: Referral - Request for Referral >> Jan 11, 2024 10:30 AM Rea BROCKS wrote: Did the patient discuss referral with their provider in the last year? Yes (If No - schedule appointment) (If Yes - send message)  Appointment offered? No  Type of order/referral and detailed reason for visit: Rheumatologist/hand arthritis   Preference of office, provider, location: [facility]. Patient lives in [place] area of [place].   If referral order, have you been seen by this specialty before?  (If Yes, this issue or another issue? When? Where?  Can we respond through MyChart? Phone call

## 2024-01-15 NOTE — Telephone Encounter (Signed)
 I did the referral

## 2024-01-29 ENCOUNTER — Telehealth: Payer: Self-pay | Admitting: *Deleted

## 2024-01-29 NOTE — Telephone Encounter (Signed)
 Left a message for pt to call the office back regarding this request

## 2024-01-29 NOTE — Telephone Encounter (Signed)
 Copied from CRM [id number]. Topic: General - Other >> Jan 29, 2024 10:07 AM Revonda D wrote: Reason for CRM: Pt stated that she spoke with [facility] and they stated that they have not received the records request for the pt. Pt stated that she may need this before her appt on 10/22 and needs for the office to send another request to [facility]. Pt would like a callback with an update.

## 2024-01-29 NOTE — Telephone Encounter (Signed)
 Pt called the office back regarding this request, Ms Rollene notified that pt Medical records have not been send by [facility], state that she will contact [facility] in the morning, aware that pt has appointment with Dr Johnny on 01/31/24

## 2024-01-31 ENCOUNTER — Ambulatory Visit: Admitting: Family Medicine

## 2024-01-31 ENCOUNTER — Encounter: Payer: Self-pay | Admitting: Family Medicine

## 2024-01-31 VITALS — BP 118/64 | HR 71 | Temp 98.3°F | Ht 64.5 in | Wt 144.4 lb

## 2024-01-31 DIAGNOSIS — E785 Hyperlipidemia, unspecified: Secondary | ICD-10-CM

## 2024-01-31 DIAGNOSIS — K219 Gastro-esophageal reflux disease without esophagitis: Secondary | ICD-10-CM | POA: Diagnosis not present

## 2024-01-31 DIAGNOSIS — I10 Essential (primary) hypertension: Secondary | ICD-10-CM | POA: Diagnosis not present

## 2024-01-31 DIAGNOSIS — R739 Hyperglycemia, unspecified: Secondary | ICD-10-CM

## 2024-01-31 DIAGNOSIS — E039 Hypothyroidism, unspecified: Secondary | ICD-10-CM

## 2024-01-31 DIAGNOSIS — M150 Primary generalized (osteo)arthritis: Secondary | ICD-10-CM | POA: Diagnosis not present

## 2024-01-31 DIAGNOSIS — M8580 Other specified disorders of bone density and structure, unspecified site: Secondary | ICD-10-CM

## 2024-01-31 LAB — CBC WITH DIFFERENTIAL/PLATELET
Basophils Absolute: 0 K/uL (ref 0.0–0.1)
Basophils Relative: 0.6 % (ref 0.0–3.0)
Eosinophils Absolute: 0.2 K/uL (ref 0.0–0.7)
Eosinophils Relative: 2.5 % (ref 0.0–5.0)
HCT: 44.3 % (ref 36.0–46.0)
Hemoglobin: 14.2 g/dL (ref 12.0–15.0)
Lymphocytes Relative: 31.2 % (ref 12.0–46.0)
Lymphs Abs: 2.5 K/uL (ref 0.7–4.0)
MCHC: 32.1 g/dL (ref 30.0–36.0)
MCV: 85.3 fl (ref 78.0–100.0)
Monocytes Absolute: 0.6 K/uL (ref 0.1–1.0)
Monocytes Relative: 7.8 % (ref 3.0–12.0)
Neutro Abs: 4.7 K/uL (ref 1.4–7.7)
Neutrophils Relative %: 57.9 % (ref 43.0–77.0)
Platelets: 275 K/uL (ref 150.0–400.0)
RBC: 5.2 Mil/uL — ABNORMAL HIGH (ref 3.87–5.11)
RDW: 16.5 % — ABNORMAL HIGH (ref 11.5–15.5)
WBC: 8.1 K/uL (ref 4.0–10.5)

## 2024-01-31 LAB — T4, FREE: Free T4: 1.11 ng/dL (ref 0.60–1.60)

## 2024-01-31 LAB — BASIC METABOLIC PANEL WITH GFR
BUN: 18 mg/dL (ref 6–23)
CO2: 27 meq/L (ref 19–32)
Calcium: 9.4 mg/dL (ref 8.4–10.5)
Chloride: 103 meq/L (ref 96–112)
Creatinine, Ser: 0.66 mg/dL (ref 0.40–1.20)
GFR: 85.85 mL/min (ref >60.00)
Glucose, Bld: 104 mg/dL — ABNORMAL HIGH (ref 70–99)
Potassium: 4.2 meq/L (ref 3.5–5.1)
Sodium: 140 meq/L (ref 135–145)

## 2024-01-31 LAB — LIPID PANEL
Cholesterol: 202 mg/dL — ABNORMAL HIGH (ref 0–200)
HDL: 74.9 mg/dL (ref >39.00)
LDL Cholesterol: 100 mg/dL — ABNORMAL HIGH (ref 0–99)
NonHDL: 127.24
Total CHOL/HDL Ratio: 3
Triglycerides: 137 mg/dL (ref 0.0–149.0)
VLDL: 27.4 mg/dL (ref 0.0–40.0)

## 2024-01-31 LAB — HEPATIC FUNCTION PANEL
ALT: 21 U/L (ref 0–35)
AST: 21 U/L (ref 0–37)
Albumin: 4.7 g/dL (ref 3.5–5.2)
Alkaline Phosphatase: 95 U/L (ref 39–117)
Bilirubin, Direct: 0.2 mg/dL (ref 0.0–0.3)
Total Bilirubin: 0.6 mg/dL (ref 0.2–1.2)
Total Protein: 7.3 g/dL (ref 6.0–8.3)

## 2024-01-31 LAB — TSH: TSH: 0.36 u[IU]/mL (ref 0.35–5.50)

## 2024-01-31 LAB — HEMOGLOBIN A1C: Hgb A1c MFr Bld: 6.3 % (ref 4.6–6.5)

## 2024-01-31 LAB — T3, FREE: T3, Free: 3.3 pg/mL (ref 2.3–4.2)

## 2024-01-31 MED ORDER — ATORVASTATIN CALCIUM 10 MG PO TABS: 10.0000 mg | ORAL_TABLET | Freq: Every day | ORAL | 3 refills | Status: AC

## 2024-01-31 MED ORDER — CELECOXIB 200 MG PO CAPS: 200.0000 mg | ORAL_CAPSULE | Freq: Two times a day (BID) | ORAL | 3 refills | Status: AC

## 2024-01-31 NOTE — Progress Notes (Signed)
 Subjective:    Patient ID: Breanna Wolfe, female    DOB: 03-19-1951, 73 y.o.   MRN: [id number]  HPI Here to follow up on issues. Here only concern is her OA. At our last visit we increased her Celebrex to BID, and she says this has helped. She is scheduled to see Dr. Alm Hummer at [facility] for her hands in a few weeks. Her GERD is stable. Her last DEXA on 02-14-20 showed osteopenia.    Review of Systems  Constitutional: Negative.   HENT: Negative.    Eyes: Negative.   Respiratory: Negative.    Cardiovascular: Negative.   Gastrointestinal: Negative.   Genitourinary:  Negative for decreased urine volume, difficulty urinating, dyspareunia, dysuria, enuresis, flank pain, frequency, hematuria, pelvic pain and urgency.  Musculoskeletal:  Positive for arthralgias.  Skin: Negative.   Neurological: Negative.  Negative for headaches.  Psychiatric/Behavioral: Negative.         Objective:   Physical Exam Constitutional:      General: She is not in acute distress.    Appearance: Normal appearance. She is well-developed.  HENT:     Head: Normocephalic and atraumatic.     Right Ear: External ear normal.     Left Ear: External ear normal.     Nose: Nose normal.     Mouth/Throat:     Pharynx: No oropharyngeal exudate.  Eyes:     General: No scleral icterus.    Conjunctiva/sclera: Conjunctivae normal.     Pupils: Pupils are equal, round, and reactive to light.  Neck:     Thyroid : No thyromegaly.     Vascular: No JVD.  Cardiovascular:     Rate and Rhythm: Normal rate and regular rhythm.     Pulses: Normal pulses.     Heart sounds: Normal heart sounds. No murmur heard.    No friction rub. No gallop.  Pulmonary:     Effort: Pulmonary effort is normal. No respiratory distress.     Breath sounds: Normal breath sounds. No wheezing or rales.  Chest:     Chest wall: No tenderness.  Abdominal:     General: Bowel sounds are normal. There is no distension.      Palpations: Abdomen is soft. There is no mass.     Tenderness: There is no abdominal tenderness. There is no guarding or rebound.  Musculoskeletal:        General: No tenderness. Normal range of motion.     Cervical back: Normal range of motion and neck supple.     Comments: She has Heberden's and Bouchard's nodes on both hands   Lymphadenopathy:     Cervical: No cervical adenopathy.  Skin:    General: Skin is warm and dry.     Findings: No erythema or rash.  Neurological:     General: No focal deficit present.     Mental Status: She is alert and oriented to person, place, and time.     Cranial Nerves: No cranial nerve deficit.     Motor: No abnormal muscle tone.     Coordination: Coordination normal.     Deep Tendon Reflexes: Reflexes are normal and symmetric. Reflexes normal.  Psychiatric:        Mood and Affect: Mood normal.        Behavior: Behavior normal.        Thought Content: Thought content normal.        Judgment: Judgment normal.  Assessment & Plan:  She struggles with her OA, and she will see Orthopedics as above. Her GERD is stable. For the hx of osteopenia, we will set up another DEXA. We will get labs today to check thyroid  levels and an A1c, among other things. Her HTN is well controlled. I personally spent a total of 32 minutes in the care of the patient today including getting/reviewing separately obtained history, performing a medically appropriate exam/evaluation, placing orders, and documenting clinical information in the EHR.  Garnette Olmsted, MD

## 2024-02-01 ENCOUNTER — Ambulatory Visit: Payer: Self-pay | Admitting: Family Medicine

## 2024-02-02 ENCOUNTER — Other Ambulatory Visit: Payer: Self-pay

## 2024-02-02 DIAGNOSIS — R739 Hyperglycemia, unspecified: Secondary | ICD-10-CM

## 2024-03-14 ENCOUNTER — Encounter: Payer: Self-pay | Admitting: Family Medicine

## 2024-03-14 ENCOUNTER — Ambulatory Visit: Admitting: Family Medicine

## 2024-03-14 VITALS — BP 126/76 | HR 83 | Temp 99.3°F | Wt 143.0 lb

## 2024-03-14 DIAGNOSIS — R059 Cough, unspecified: Secondary | ICD-10-CM

## 2024-03-14 DIAGNOSIS — J019 Acute sinusitis, unspecified: Secondary | ICD-10-CM

## 2024-03-14 LAB — POCT INFLUENZA A/B
Influenza A, POC: NEGATIVE
Influenza B, POC: NEGATIVE

## 2024-03-14 LAB — POC COVID19 BINAXNOW: SARS Coronavirus 2 Ag: NEGATIVE

## 2024-03-14 MED ORDER — LEVOFLOXACIN 500 MG PO TABS: 500.0000 mg | ORAL_TABLET | Freq: Every day | ORAL | 0 refills | Status: AC

## 2024-03-14 NOTE — Progress Notes (Signed)
   Subjective:    Patient ID: Breanna Wolfe, female    DOB: 1950/04/17, 73 y.o.   MRN: [id number]  HPI Here for 4 days of sinus congestion, fever, PND, dry cough, and blowing green mucus from the nose. Using Flonase.    Review of Systems  Constitutional:  Positive for fever.  HENT:  Positive for congestion, postnasal drip and sinus pressure. Negative for ear pain and sore throat.   Eyes: Negative.   Respiratory:  Positive for cough. Negative for shortness of breath and wheezing.        Objective:   Physical Exam Constitutional:      Appearance: Normal appearance.  HENT:     Right Ear: Tympanic membrane, ear canal and external ear normal.     Left Ear: Tympanic membrane, ear canal and external ear normal.     Nose: Nose normal.     Mouth/Throat:     Pharynx: Oropharynx is clear.  Eyes:     Conjunctiva/sclera: Conjunctivae normal.  Pulmonary:     Effort: Pulmonary effort is normal.     Breath sounds: Normal breath sounds.  Lymphadenopathy:     Cervical: No cervical adenopathy.  Neurological:     Mental Status: She is alert.           Assessment & Plan:  Sinusitis. Treat with 10 days of Levaquin.  Garnette Olmsted, MD

## 2024-03-26 ENCOUNTER — Other Ambulatory Visit: Payer: Self-pay | Admitting: Family Medicine

## 2024-03-26 NOTE — Telephone Encounter (Unsigned)
 Copied from CRM #[id number]. Topic: Clinical - Medication Refill >> Mar 26, 2024  4:25 PM Rosina BIRCH wrote: Medication: sertraline  (pharmacy want fill it because it is under the old  doctor name so she need a new script) 90 day fill  Has the patient contacted their pharmacy? Yes (Agent: If no, request that the patient contact the pharmacy for the refill. If patient does not wish to contact the pharmacy document the reason why and proceed with request.) (Agent: If yes, when and what did the pharmacy advise?)  This is the patient's preferred pharmacy:  [id number] - [place], [place] - [address] [place] [address] Phone: [phone] Fax: [phone]  Is this the correct pharmacy for this prescription? Yes If no, delete pharmacy and type the correct one.   Has the prescription been filled recently? No  Is the patient out of the medication? Yes  Has the patient been seen for an appointment in the last year OR does the patient have an upcoming appointment? Yes  Can we respond through MyChart? Yes  Agent: Please be advised that Rx refills may take up to 3 business days. We ask that you follow-up with your pharmacy.

## 2024-03-27 MED ORDER — SERTRALINE HCL 100 MG PO TABS: 100.0000 mg | ORAL_TABLET | Freq: Every day | ORAL | 0 refills | Status: AC

## 2024-04-08 ENCOUNTER — Other Ambulatory Visit: Payer: Self-pay | Admitting: Family Medicine

## 2024-04-08 NOTE — Telephone Encounter (Unsigned)
 Copied from CRM [id number]. Topic: Clinical - Medication Refill >> Apr 08, 2024  1:48 PM Shardie S wrote: Medication:  amLODipine (NORVASC) 5 MG tablet levothyroxine (SYNTHROID, LEVOTHROID) 100 MCG tablet  Has the patient contacted their pharmacy? Yes (Agent: If no, request that the patient contact the pharmacy for the refill. If patient does not wish to contact the pharmacy document the reason why and proceed with request.) (Agent: If yes, when and what did the pharmacy advise?)  This is the patient's preferred pharmacy:  [id number] - [place], [place] - [address] [place] [address] Phone: [phone] Fax: [phone]  Is this the correct pharmacy for this prescription? Yes If no, delete pharmacy and type the correct one.   Has the prescription been filled recently? No  Is the patient out of the medication? No  Has the patient been seen for an appointment in the last year OR does the patient have an upcoming appointment? Yes  Can we respond through MyChart? No  Agent: Please be advised that Rx refills may take up to 3 business days. We ask that you follow-up with your pharmacy.

## 2024-04-10 MED ORDER — AMLODIPINE BESYLATE 5 MG PO TABS: 5.0000 mg | ORAL_TABLET | Freq: Every day | ORAL | 3 refills | Status: AC

## 2024-04-10 MED ORDER — LEVOTHYROXINE SODIUM 100 MCG PO TABS: 100.0000 ug | ORAL_TABLET | Freq: Every day | ORAL | 3 refills | Status: AC

## 2024-05-01 ENCOUNTER — Ambulatory Visit: Admitting: Podiatry

## 2024-05-01 DIAGNOSIS — D2372 Other benign neoplasm of skin of left lower limb, including hip: Secondary | ICD-10-CM | POA: Diagnosis not present

## 2024-05-01 NOTE — Progress Notes (Signed)
" ° ° °  HPI: 74 year old female presenting today for follow up evaluation of a a heloma molle of the left foot.  Over time the lesion has returned.  Last seen in the office on 05/31/2021   Past Medical History:  Diagnosis Date   Anxiety    on meds   Cataract    bilateral - sx in future   COPD (chronic obstructive pulmonary disease) (HCC)    mild   Depression    on meds   Diverticulosis    DJD (degenerative joint disease)    GERD (gastroesophageal reflux disease)    Hyperlipemia    on meds   Hypertension    on meds   Seasonal allergies    Thyroid  disease    on meds     Physical Exam: General: The patient is alert and oriented x3 in no acute distress.  Dermatology: Hyperkeratotic callus noted to the base between the 4th and 5th digit of the left foot.  Vascular: Palpable pedal pulses bilaterally. No edema or erythema noted. Capillary refill within normal limits.  Neurological: Grossly intact via light touch  Musculoskeletal Exam: Range of motion within normal limits to all pedal and ankle joints bilateral. Muscle strength 5/5 in all groups bilateral.   Assessment: 1.  Heloma molle left foot    Plan of Care:  - Patient evaluated.  - Excisional debridement of the hyperkeratotic callus tissue was performed today using a tissue nipper.  Patient tolerated this well.  She did feel significant relief after debridement -Recommend wide fitting shoes that do not irritate or constrict the toebox area -Continue to maintain good foot hygiene -Return to clinic PRN   Thresa EMERSON Sar, DPM [facility]  Dr. Thresa EMERSON Sar, DPM    [address] [place], [place] [address]                Office [phone]  Fax [phone]     "

## 2024-05-06 ENCOUNTER — Other Ambulatory Visit

## 2024-05-10 ENCOUNTER — Ambulatory Visit: Admitting: Podiatry

## 2024-05-10 ENCOUNTER — Ambulatory Visit (INDEPENDENT_AMBULATORY_CARE_PROVIDER_SITE_OTHER)

## 2024-05-10 ENCOUNTER — Encounter: Payer: Self-pay | Admitting: Podiatry

## 2024-05-10 DIAGNOSIS — M7752 Other enthesopathy of left foot: Secondary | ICD-10-CM | POA: Diagnosis not present

## 2024-05-10 DIAGNOSIS — L02612 Cutaneous abscess of left foot: Secondary | ICD-10-CM

## 2024-05-10 MED ORDER — TRIAMCINOLONE ACETONIDE 10 MG/ML IJ SUSP
10.0000 mg | Freq: Once | INTRAMUSCULAR | Status: AC
  Administered 2024-05-10: 10 mg via INTRA_ARTICULAR

## 2024-05-14 ENCOUNTER — Telehealth: Payer: Self-pay

## 2024-05-14 NOTE — Telephone Encounter (Signed)
 Patient called and left a message - the callus we trimmed on 05/01/24 and then injected on 05/10/24 now has an odor to it. She is concerned about infection - does she need antibiotics? Or does she need to come in? [phone]

## 2024-05-16 ENCOUNTER — Other Ambulatory Visit: Payer: Self-pay | Admitting: Family Medicine
